# Patient Record
Sex: Female | Born: 1940 | Race: White | Hispanic: No | Marital: Married | Smoking: Former smoker
Health system: Southern US, Community
[De-identification: ages and names within clinical notes are randomized; demographics above are authoritative.]

## PROBLEM LIST (undated history)

## (undated) DIAGNOSIS — K5090 Crohn's disease, unspecified, without complications: Secondary | ICD-10-CM

## (undated) DIAGNOSIS — T4145XA Adverse effect of unspecified anesthetic, initial encounter: Secondary | ICD-10-CM

## (undated) DIAGNOSIS — R112 Nausea with vomiting, unspecified: Secondary | ICD-10-CM

## (undated) DIAGNOSIS — D649 Anemia, unspecified: Secondary | ICD-10-CM

## (undated) DIAGNOSIS — T8859XA Other complications of anesthesia, initial encounter: Secondary | ICD-10-CM

## (undated) DIAGNOSIS — Z9889 Other specified postprocedural states: Secondary | ICD-10-CM

## (undated) DIAGNOSIS — Z87442 Personal history of urinary calculi: Secondary | ICD-10-CM

## (undated) HISTORY — DX: Crohn's disease, unspecified, without complications: K50.90

## (undated) HISTORY — PX: COLON SURGERY: SHX602

## (undated) HISTORY — PX: CHOLECYSTECTOMY: SHX55

## (undated) HISTORY — PX: APPENDECTOMY: SHX54

## (undated) HISTORY — PX: SMALL INTESTINE SURGERY: SHX150

---

## 1979-08-11 HISTORY — PX: NEPHROSTOMY: SHX1014

## 2009-06-24 ENCOUNTER — Ambulatory Visit (HOSPITAL_BASED_OUTPATIENT_CLINIC_OR_DEPARTMENT_OTHER): Admission: RE | Admit: 2009-06-24 | Discharge: 2009-06-24 | Payer: Self-pay | Admitting: Orthopedic Surgery

## 2010-11-12 LAB — POCT I-STAT, CHEM 8
BUN: 18 mg/dL (ref 6–23)
Creatinine, Ser: 0.8 mg/dL (ref 0.4–1.2)
Hemoglobin: 13.6 g/dL (ref 12.0–15.0)
Potassium: 4 mEq/L (ref 3.5–5.1)
Sodium: 140 mEq/L (ref 135–145)
TCO2: 20 mmol/L (ref 0–100)

## 2010-11-12 LAB — POCT HEMOGLOBIN-HEMACUE: Hemoglobin: 14 g/dL (ref 12.0–15.0)

## 2010-11-12 LAB — BASIC METABOLIC PANEL
CO2: 26 mEq/L (ref 19–32)
Chloride: 106 mEq/L (ref 96–112)
GFR calc Af Amer: >60 mL/min (ref >60)
Sodium: 139 mEq/L (ref 135–145)

## 2015-09-11 ENCOUNTER — Other Ambulatory Visit (INDEPENDENT_AMBULATORY_CARE_PROVIDER_SITE_OTHER): Payer: Medicare Other

## 2015-09-11 ENCOUNTER — Ambulatory Visit (INDEPENDENT_AMBULATORY_CARE_PROVIDER_SITE_OTHER): Payer: Medicare Other | Admitting: Gastroenterology

## 2015-09-11 ENCOUNTER — Encounter: Payer: Self-pay | Admitting: Gastroenterology

## 2015-09-11 VITALS — BP 110/80 | HR 72 | Ht 60.0 in | Wt 192.8 lb

## 2015-09-11 DIAGNOSIS — K921 Melena: Secondary | ICD-10-CM

## 2015-09-11 DIAGNOSIS — K50119 Crohn's disease of large intestine with unspecified complications: Secondary | ICD-10-CM | POA: Diagnosis not present

## 2015-09-11 DIAGNOSIS — R1031 Right lower quadrant pain: Secondary | ICD-10-CM | POA: Diagnosis not present

## 2015-09-11 LAB — CBC WITH DIFFERENTIAL/PLATELET
BASOS ABS: 0.1 10*3/uL (ref 0.0–0.1)
Basophils Relative: 0.5 % (ref 0.0–3.0)
EOS ABS: 0.2 10*3/uL (ref 0.0–0.7)
Eosinophils Relative: 1.8 % (ref 0.0–5.0)
HEMATOCRIT: 38.5 % (ref 36.0–46.0)
Hemoglobin: 12.6 g/dL (ref 12.0–15.0)
LYMPHS ABS: 3.3 10*3/uL (ref 0.7–4.0)
LYMPHS PCT: 30.9 % (ref 12.0–46.0)
MCHC: 32.6 g/dL (ref 30.0–36.0)
MCV: 91.2 fl (ref 78.0–100.0)
MONOS PCT: 8.9 % (ref 3.0–12.0)
Monocytes Absolute: 0.9 10*3/uL (ref 0.1–1.0)
NEUTROS ABS: 6.2 10*3/uL (ref 1.4–7.7)
NEUTROS PCT: 57.9 % (ref 43.0–77.0)
PLATELETS: 296 10*3/uL (ref 150.0–400.0)
RBC: 4.22 Mil/uL (ref 3.87–5.11)
RDW: 15.1 % (ref 11.5–15.5)
WBC: 10.7 10*3/uL — ABNORMAL HIGH (ref 4.0–10.5)

## 2015-09-11 LAB — BASIC METABOLIC PANEL
BUN: 18 mg/dL (ref 6–23)
CALCIUM: 10 mg/dL (ref 8.4–10.5)
CO2: 27 mEq/L (ref 19–32)
Chloride: 106 mEq/L (ref 96–112)
Creatinine, Ser: 0.78 mg/dL (ref 0.40–1.20)
GFR: 77.01 mL/min (ref >60.00)
GLUCOSE: 118 mg/dL — AB (ref 70–99)
Potassium: 4.1 mEq/L (ref 3.5–5.1)
Sodium: 143 mEq/L (ref 135–145)

## 2015-09-11 LAB — HEPATIC FUNCTION PANEL
ALK PHOS: 86 U/L (ref 39–117)
ALT: 21 U/L (ref 0–35)
AST: 27 U/L (ref 0–37)
Albumin: 3.8 g/dL (ref 3.5–5.2)
BILIRUBIN DIRECT: 0.2 mg/dL (ref 0.0–0.3)
TOTAL PROTEIN: 6.9 g/dL (ref 6.0–8.3)
Total Bilirubin: 0.5 mg/dL (ref 0.2–1.2)

## 2015-09-11 LAB — SEDIMENTATION RATE: Sed Rate: 22 mm/hr (ref 0–22)

## 2015-09-11 LAB — VITAMIN B12: VITAMIN B 12: 249 pg/mL (ref 211–911)

## 2015-09-11 LAB — FOLATE

## 2015-09-11 LAB — FERRITIN: FERRITIN: 28 ng/mL (ref 10.0–291.0)

## 2015-09-11 MED ORDER — PANTOPRAZOLE SODIUM 40 MG PO TBEC: 40.0000 mg | DELAYED_RELEASE_TABLET | Freq: Every day | ORAL | Status: DC

## 2015-09-11 NOTE — Progress Notes (Signed)
Kara Schultz    [id number]    1941-03-10  Primary Care Physician:MAHONEY,MARK, DO  Referring Physician: No referring provider defined for this encounter.  Chief complaint:  Melena  HPI: 75 year old female with long-standing history of Crohn's disease diagnosed about 50 years ago, is s/p R hemicolectomy along with half of ileum was resected. She reports starting to have pain on R side 2 months ago, that comes and goes. No association with diet or activity.  She had an episode of Shingles on L abdomen extending onto her back a few weeks ago and started taking Tramadol and Hydrocodone for pain control in addition to the Alleve 2 a day that she is been taking for the past year for arthritis. She noticed black tarry stool since Sunday for past 4 days, no increase in stool frequency. At baseline she has 5-6 bowel movements on a regular day and during flareup she could have 10-15 bowel movements a day. She hasn't been on any maintenance therapy for Crohn's for the past 30-40 years and is mostly managing with diet. Denies any epigastric abdominal pain, nausea, vomiting or bright red blood per rectum       Outpatient Encounter Prescriptions as of 09/11/2015  Medication Sig  . Cyanocobalamin (VITAMIN B-12) 1000 MCG/15ML LIQD Take by mouth every 30 (thirty) days.  Marland Kitchen glucosamine-chondroitin 500-400 MG tablet Take 1 tablet by mouth 3 (three) times daily.  . Multiple Vitamin (MULTIVITAMIN) tablet Take 1 tablet by mouth daily.  . naproxen sodium (ANAPROX) 220 MG tablet Take 220 mg by mouth 2 (two) times daily with a meal.  . NON FORMULARY Cur-A-Med 1 capsule daily  . traMADol (ULTRAM) 50 MG tablet Take 50 mg by mouth every 6 (six) hours as needed.   No facility-administered encounter medications on file as of 09/11/2015.    Allergies as of 09/11/2015  . (No Known Allergies)    Past Medical History  Diagnosis Date  . Crohn's disease [facility])     Past Surgical History  Procedure  Laterality Date  . Appendectomy    . Cholecystectomy    . Colon surgery    . Small intestine surgery      Family History  Problem Relation Age of Onset  . Breast cancer Mother   . Prostate cancer Father   . Prostate cancer Brother   . Colon cancer Brother   . Crohn's disease Brother   . Lung cancer Mother     Social History   Social History  . Marital Status: Married    Spouse Name: N/A  . Number of Children: 2  . Years of Education: N/A   Occupational History  . Retired    Social History Main Topics  . Smoking status: Former Research scientist (life sciences)  . Smokeless tobacco: Never Used  . Alcohol Use: No  . Drug Use: No  . Sexual Activity: Not on file   Other Topics Concern  . Not on file   Social History Narrative  . No narrative on file      Review of systems: Review of Systems  Constitutional: Negative for fever and chills.  HENT: Negative.   Eyes: Negative for blurred vision.  Respiratory: Negative for cough, shortness of breath and wheezing.   Cardiovascular: Negative for chest pain and palpitations.  Gastrointestinal: as per HPI Genitourinary: Negative for dysuria, urgency, frequency and hematuria.  Musculoskeletal: Negative for myalgias, back pain and joint pain.  Skin: Negative for itching and rash.  Neurological: Negative for dizziness, tremors, focal weakness, seizures and loss of consciousness.  Endo/Heme/Allergies: Negative for environmental allergies.  Psychiatric/Behavioral: Negative for depression, suicidal ideas and hallucinations.  All other systems reviewed and are negative.   Physical Exam: Filed Vitals:   09/11/15 1512  BP: 110/80  Pulse: 72   Gen:      No acute distress HEENT:  EOMI, sclera anicteric Neck:     No masses; no thyromegaly Lungs:    Clear to auscultation bilaterally; normal respiratory effort CV:         Regular rate and rhythm; no murmurs Abd:      + bowel sounds; soft, non-tender; no palpable masses, no distension Ext:    No  edema; adequate peripheral perfusion Skin:      Warm and dry; no rash Neuro: alert and oriented x 3 Psych: normal mood and affect   Assessment and Plan/Recommendations:  75 year old female with long-standing history of Crohn's disease status post right hemicolectomy and partial ileectomy here with complaints of melena and right lower quadrant abdominal pain for the past 4 days. She has history of chronic NSAID use Check CBC, BMP, LFT, ESR, iron panel, B12 & folate If labs are stable vital schedule for endoscopy electively as outpatient but if has any significant drop in hemoglobin we will have to consider admitting the patient to the hospital and do an urgent EGD Advise patient to seek medical care if she develops dizziness or starts having an increase in episodes  Had a colonoscopy in 2013 in [place], we'll try to obtain the records   K. Denzil Magnuson , MD [phone] Mon-Fri 8a-5p [phone] after 5p, weekends, holidays

## 2015-09-11 NOTE — Patient Instructions (Signed)
Go to the basement for labs today  You have been scheduled for an endoscopy. Please follow written instructions given to you at your visit today. If you use inhalers (even only as needed), please bring them with you on the day of your procedure. Your physician has requested that you go to www.startemmi.com and enter the access code given to you at your visit today. This web site gives a general overview about your procedure. However, you should still follow specific instructions given to you by our office regarding your preparation for the procedure. 

## 2015-09-12 ENCOUNTER — Telehealth: Payer: Self-pay | Admitting: Gastroenterology

## 2015-09-12 NOTE — Telephone Encounter (Signed)
ok 

## 2015-09-12 NOTE — Telephone Encounter (Signed)
Patient says she is doing "a whole lot better". She would like to cancel the EGD.

## 2015-10-16 ENCOUNTER — Encounter: Payer: Medicare Other | Admitting: Gastroenterology

## 2017-01-01 ENCOUNTER — Other Ambulatory Visit: Payer: Self-pay | Admitting: Physician Assistant

## 2017-01-01 ENCOUNTER — Ambulatory Visit: Payer: Medicare Other

## 2017-01-01 ENCOUNTER — Encounter: Payer: Self-pay | Admitting: Physician Assistant

## 2017-01-01 ENCOUNTER — Ambulatory Visit (INDEPENDENT_AMBULATORY_CARE_PROVIDER_SITE_OTHER): Payer: Medicare Other

## 2017-01-01 ENCOUNTER — Ambulatory Visit (INDEPENDENT_AMBULATORY_CARE_PROVIDER_SITE_OTHER): Payer: Medicare Other | Admitting: Physician Assistant

## 2017-01-01 VITALS — BP 127/77 | HR 82 | Ht 60.0 in | Wt 185.0 lb

## 2017-01-01 DIAGNOSIS — W108XXA Fall (on) (from) other stairs and steps, initial encounter: Secondary | ICD-10-CM

## 2017-01-01 DIAGNOSIS — S8002XA Contusion of left knee, initial encounter: Secondary | ICD-10-CM | POA: Diagnosis not present

## 2017-01-01 DIAGNOSIS — M1712 Unilateral primary osteoarthritis, left knee: Secondary | ICD-10-CM | POA: Insufficient documentation

## 2017-01-01 DIAGNOSIS — M25562 Pain in left knee: Secondary | ICD-10-CM

## 2017-01-01 DIAGNOSIS — W19XXXA Unspecified fall, initial encounter: Secondary | ICD-10-CM

## 2017-01-01 MED ORDER — TRAMADOL HCL 50 MG PO TABS: 50.0000 mg | ORAL_TABLET | Freq: Three times a day (TID) | ORAL | 0 refills | Status: DC | PRN

## 2017-01-01 NOTE — Progress Notes (Signed)
BP 127/77   Pulse 82   Ht 5' (1.524 m)   Wt 185 lb (83.9 kg)   BMI 36.13 kg/m    Subjective:    Patient ID: Kara Schultz, female    DOB: 1941-01-31, 76 y.o.   MRN: [id number]  HPI: Kara Schultz is a 76 y.o. female presenting on 01/01/2017 for Fall (hurt left knee)  This is a new patient to our office today. She has known degenerative joint disease of the left knee. She is planning a total knee replacement in a few weeks. She is seen by Dr. Lequita Halt in [place].  Last night she was startled by upper when she opened up her basement door. It caused her to fall directly on her left knee at the same time she hit her left forehead. She had not had any problems with dizziness or loss of consciousness. The pain was very sharp in the left knee. She had a very large amount of swelling to the anterior portion of the knee through the night it did go down. Should very minimal sleep during the night due to the pain. She is seen in our office for an x-ray and support until she can get to the orthopedist.  Relevant past medical, surgical, family and social history reviewed and updated as indicated. Allergies and medications reviewed and updated.  Past Medical History:  Diagnosis Date  . Crohn's disease [facility])     Past Surgical History:  Procedure Laterality Date  . APPENDECTOMY    . CHOLECYSTECTOMY    . COLON SURGERY    . SMALL INTESTINE SURGERY      Review of Systems  Constitutional: Negative.  Negative for activity change, fatigue and fever.  HENT: Negative.   Eyes: Negative.   Respiratory: Negative.  Negative for cough.   Cardiovascular: Negative.  Negative for chest pain.  Gastrointestinal: Negative.  Negative for abdominal pain.  Endocrine: Negative.   Genitourinary: Negative.  Negative for dysuria.  Musculoskeletal: Positive for arthralgias, gait problem and joint swelling.  Skin: Positive for color change.    Allergies as of 01/01/2017      Reactions   Hydrocodone       Medication List       Accurate as of 01/01/17  4:11 PM. Always use your most recent med list.          traMADol 50 MG tablet Commonly known as:  ULTRAM Take 1-2 tablets (50-100 mg total) by mouth every 8 (eight) hours as needed.   Vitamin B-12 1000 MCG/15ML Liqd Take by mouth every 30 (thirty) days.   Vitamin D3 50000 units Caps Vitamin D2 50,000 unit capsule  Take 1 capsule every week by oral route for 30 days.          Objective:    BP 127/77   Pulse 82   Ht 5' (1.524 m)   Wt 185 lb (83.9 kg)   BMI 36.13 kg/m   Allergies  Allergen Reactions  . Hydrocodone     Physical Exam  Constitutional: She is oriented to person, place, and time. She appears well-developed and well-nourished.  HENT:  Head: Normocephalic and atraumatic.  Eyes: Conjunctivae and EOM are normal. Pupils are equal, round, and reactive to light.  Cardiovascular: Normal rate, regular rhythm, normal heart sounds and intact distal pulses.   Pulmonary/Chest: Effort normal and breath sounds normal.  Abdominal: Soft. Bowel sounds are normal.  Musculoskeletal:       Left knee: She exhibits decreased range of motion,  swelling and deformity. Tenderness found. Lateral joint line tenderness noted.       Legs: Significant swelling in the left knee with bruising. There is lateral tenderness and decreased range of motion. She even hurts into the posterior portion of the knee  Neurological: She is alert and oriented to person, place, and time. She has normal reflexes.  Skin: Skin is warm and dry. No rash noted.  Psychiatric: She has a normal mood and affect. Her behavior is normal. Judgment and thought content normal.        Assessment & Plan:   1. Fall, initial encounter - traMADol (ULTRAM) 50 MG tablet; Take 1-2 tablets (50-100 mg total) by mouth every 8 (eight) hours as needed.  Dispense: 60 tablet; Refill: 0  2. Contusion of left knee, initial encounter - traMADol (ULTRAM) 50 MG tablet; Take 1-2  tablets (50-100 mg total) by mouth every 8 (eight) hours as needed.  Dispense: 60 tablet; Refill: 0  3. Primary osteoarthritis of left knee   Current Outpatient Prescriptions:  .  Cholecalciferol (VITAMIN D3) 50000 units CAPS, Vitamin D2 50,000 unit capsule  Take 1 capsule every week by oral route for 30 days., Disp: , Rfl:  .  Cyanocobalamin (VITAMIN B-12) 1000 MCG/15ML LIQD, Take by mouth every 30 (thirty) days., Disp: , Rfl:  .  traMADol (ULTRAM) 50 MG tablet, Take 1-2 tablets (50-100 mg total) by mouth every 8 (eight) hours as needed., Disp: 60 tablet, Rfl: 0  Continue all other maintenance medications as listed above.  Follow up plan: Return if symptoms worsen or fail to improve.  Educational handout given for contusion  Remus Loffler PA-C [facility] [address]  [place], [place] [address] [phone]   01/01/2017, 4:11 PM

## 2017-01-01 NOTE — Patient Instructions (Signed)
Contusion A contusion is a deep bruise. Contusions happen when an injury causes bleeding under the skin. Symptoms of bruising include pain, swelling, and discolored skin. The skin may turn blue, purple, or yellow. Follow these instructions at home:  Rest the injured area.  If told, put ice on the injured area.  Put ice in a plastic bag.  Place a towel between your skin and the bag.  Leave the ice on for 20 minutes, 2-3 times per day.  If told, put light pressure (compression) on the injured area using an elastic bandage. Make sure the bandage is not too tight. Remove it and put it back on as told by your doctor.  If possible, raise (elevate) the injured area above the level of your heart while you are sitting or lying down.  Take over-the-counter and prescription medicines only as told by your doctor. Contact a doctor if:  Your symptoms do not get better after several days of treatment.  Your symptoms get worse.  You have trouble moving the injured area. Get help right away if:  You have very bad pain.  You have a loss of feeling (numbness) in a hand or foot.  Your hand or foot turns pale or cold. This information is not intended to replace advice given to you by your health care provider. Make sure you discuss any questions you have with your health care provider. Document Released: 01/13/2008 Document Revised: 01/02/2016 Document Reviewed: 12/12/2014 Elsevier Interactive Patient Education  2017 Elsevier Inc.  

## 2017-02-12 NOTE — Progress Notes (Signed)
Please place orders in EPIC as patient is being scheduled for a pre-op appointment! 

## 2017-02-16 ENCOUNTER — Ambulatory Visit: Payer: Self-pay | Admitting: Orthopedic Surgery

## 2017-02-23 NOTE — Patient Instructions (Addendum)
Kara Schultz  02/23/2017   Your procedure is scheduled on: 03-08-17  Report to [facility] Main  Entrance Report to Admitting at 9:05 AM   Call this number if you have problems the morning of surgery [phone]   Remember: ONLY 1 PERSON MAY GO WITH YOU TO SHORT STAY TO GET  READY MORNING OF YOUR SURGERY.  Do not eat food or drink liquids :After Midnight.     Take these medicines the morning of surgery with A SIP OF WATER: None                                You may not have any metal on your body including hair pins and              piercings  Do not wear jewelry, make-up, lotions, powders or perfumes, deodorant             Do not wear nail polish.  Do not shave  48 hours prior to surgery.     Do not bring valuables to the hospital. [facility] IS NOT             RESPONSIBLE   FOR VALUABLES.  Contacts, dentures or bridgework may not be worn into surgery.  Leave suitcase in the car. After surgery it may be brought to your room.                  Please read over the following fact sheets you were given: _____________________________________________________________________             [facility] - Preparing for Surgery Before surgery, you can play an important role.  Because skin is not sterile, your skin needs to be as free of germs as possible.  You can reduce the number of germs on your skin by washing with CHG (chlorahexidine gluconate) soap before surgery.  CHG is an antiseptic cleaner which kills germs and bonds with the skin to continue killing germs even after washing. Please DO NOT use if you have an allergy to CHG or antibacterial soaps.  If your skin becomes reddened/irritated stop using the CHG and inform your nurse when you arrive at Short Stay. Do not shave (including legs and underarms) for at least 48 hours prior to the first CHG shower.  You may shave your face/neck. Please follow these instructions carefully:  1.  Shower with CHG  Soap the night before surgery and the  morning of Surgery.  2.  If you choose to wash your hair, wash your hair first as usual with your  normal  shampoo.  3.  After you shampoo, rinse your hair and body thoroughly to remove the  shampoo.                           4.  Use CHG as you would any other liquid soap.  You can apply chg directly  to the skin and wash                       Gently with a scrungie or clean washcloth.  5.  Apply the CHG Soap to your body ONLY FROM THE NECK DOWN.   Do not use on face/ open  Wound or open sores. Avoid contact with eyes, ears mouth and genitals (private parts).                       Wash face,  Genitals (private parts) with your normal soap.             6.  Wash thoroughly, paying special attention to the area where your surgery  will be performed.  7.  Thoroughly rinse your body with warm water from the neck down.  8.  DO NOT shower/wash with your normal soap after using and rinsing off  the CHG Soap.                9.  Pat yourself dry with a clean towel.            10.  Wear clean pajamas.            11.  Place clean sheets on your bed the night of your first shower and do not  sleep with pets. Day of Surgery : Do not apply any lotions/deodorants the morning of surgery.  Please wear clean clothes to the hospital/surgery center.  FAILURE TO FOLLOW THESE INSTRUCTIONS MAY RESULT IN THE CANCELLATION OF YOUR SURGERY PATIENT SIGNATURE_________________________________  NURSE SIGNATURE__________________________________  ________________________________________________________________________   Kara Schultz  An incentive spirometer is a tool that can help keep your lungs clear and active. This tool measures how well you are filling your lungs with each breath. Taking long deep breaths may help reverse or decrease the chance of developing breathing (pulmonary) problems (especially infection) following:  A long period of time  when you are unable to move or be active. BEFORE THE PROCEDURE   If the spirometer includes an indicator to show your best effort, your nurse or respiratory therapist will set it to a desired goal.  If possible, sit up straight or lean slightly forward. Try not to slouch.  Hold the incentive spirometer in an upright position. INSTRUCTIONS FOR USE  1. Sit on the edge of your bed if possible, or sit up as far as you can in bed or on a chair. 2. Hold the incentive spirometer in an upright position. 3. Breathe out normally. 4. Place the mouthpiece in your mouth and seal your lips tightly around it. 5. Breathe in slowly and as deeply as possible, raising the piston or the ball toward the top of the column. 6. Hold your breath for 3-5 seconds or for as long as possible. Allow the piston or ball to fall to the bottom of the column. 7. Remove the mouthpiece from your mouth and breathe out normally. 8. Rest for a few seconds and repeat Steps 1 through 7 at least 10 times every 1-2 hours when you are awake. Take your time and take a few normal breaths between deep breaths. 9. The spirometer may include an indicator to show your best effort. Use the indicator as a goal to work toward during each repetition. 10. After each set of 10 deep breaths, practice coughing to be sure your lungs are clear. If you have an incision (the cut made at the time of surgery), support your incision when coughing by placing a pillow or rolled up towels firmly against it. Once you are able to get out of bed, walk around indoors and cough well. You may stop using the incentive spirometer when instructed by your caregiver.  RISKS AND COMPLICATIONS  Take your time so you do not get  dizzy or light-headed.  If you are in pain, you may need to take or ask for pain medication before doing incentive spirometry. It is harder to take a deep breath if you are having pain. AFTER USE  Rest and breathe slowly and easily.  It can be  helpful to keep track of a log of your progress. Your caregiver can provide you with a simple table to help with this. If you are using the spirometer at home, follow these instructions: [place] IF:   You are having difficultly using the spirometer.  You have trouble using the spirometer as often as instructed.  Your pain medication is not giving enough relief while using the spirometer.  You develop fever of 100.5 F (38.1 C) or higher. SEEK IMMEDIATE MEDICAL CARE IF:   You cough up bloody sputum that had not been present before.  You develop fever of 102 F (38.9 C) or greater.  You develop worsening pain at or near the incision site. MAKE SURE YOU:   Understand these instructions.  Will watch your condition.  Will get help right away if you are not doing well or get worse. Document Released: 12/07/2006 Document Revised: 10/19/2011 Document Reviewed: 02/07/2007 ExitCare Patient Information 2014 ExitCare, [place].   ________________________________________________________________________  WHAT IS A BLOOD TRANSFUSION? Blood Transfusion Information  A transfusion is the replacement of blood or some of its parts. Blood is made up of multiple cells which provide different functions.  Red blood cells carry oxygen and are used for blood loss replacement.  White blood cells fight against infection.  Platelets control bleeding.  Plasma helps clot blood.  Other blood products are available for specialized needs, such as hemophilia or other clotting disorders. BEFORE THE TRANSFUSION  Who gives blood for transfusions?   Healthy volunteers who are fully evaluated to make sure their blood is safe. This is blood bank blood. Transfusion therapy is the safest it has ever been in the practice of medicine. Before blood is taken from a donor, a complete history is taken to make sure that person has no history of diseases nor engages in risky social behavior (examples are  intravenous drug use or sexual activity with multiple partners). The donor's travel history is screened to minimize risk of transmitting infections, such as malaria. The donated blood is tested for signs of infectious diseases, such as HIV and hepatitis. The blood is then tested to be sure it is compatible with you in order to minimize the chance of a transfusion reaction. If you or a relative donates blood, this is often done in anticipation of surgery and is not appropriate for emergency situations. It takes many days to process the donated blood. RISKS AND COMPLICATIONS Although transfusion therapy is very safe and saves many lives, the main dangers of transfusion include:   Getting an infectious disease.  Developing a transfusion reaction. This is an allergic reaction to something in the blood you were given. Every precaution is taken to prevent this. The decision to have a blood transfusion has been considered carefully by your caregiver before blood is given. Blood is not given unless the benefits outweigh the risks. AFTER THE TRANSFUSION  Right after receiving a blood transfusion, you will usually feel much better and more energetic. This is especially true if your red blood cells have gotten low (anemic). The transfusion raises the level of the red blood cells which carry oxygen, and this usually causes an energy increase.  The nurse administering the transfusion will  monitor you carefully for complications. HOME CARE INSTRUCTIONS  No special instructions are needed after a transfusion. You may find your energy is better. Speak with your caregiver about any limitations on activity for underlying diseases you may have. SEEK MEDICAL CARE IF:   Your condition is not improving after your transfusion.  You develop redness or irritation at the intravenous (IV) site. SEEK IMMEDIATE MEDICAL CARE IF:  Any of the following symptoms occur over the next 12 hours:  Shaking chills.  You have a  temperature by mouth above 102 F (38.9 C), not controlled by medicine.  Chest, back, or muscle pain.  People around you feel you are not acting correctly or are confused.  Shortness of breath or difficulty breathing.  Dizziness and fainting.  You get a rash or develop hives.  You have a decrease in urine output.  Your urine turns a dark color or changes to pink, red, or brown. Any of the following symptoms occur over the next 10 days:  You have a temperature by mouth above 102 F (38.9 C), not controlled by medicine.  Shortness of breath.  Weakness after normal activity.  The white part of the eye turns yellow (jaundice).  You have a decrease in the amount of urine or are urinating less often.  Your urine turns a dark color or changes to pink, red, or brown. Document Released: 07/24/2000 Document Revised: 10/19/2011 Document Reviewed: 03/12/2008 [facility] Patient Information 2014 [place], [place].  _______________________________________________________________________

## 2017-02-23 NOTE — Progress Notes (Addendum)
02-15-17 Surgical clearance on chart from Marrian Salvage, PAC, 02-15-17 EKG, CXR, and office note on chart.

## 2017-02-25 ENCOUNTER — Encounter (HOSPITAL_COMMUNITY): Payer: Self-pay

## 2017-02-25 ENCOUNTER — Encounter (HOSPITAL_COMMUNITY)
Admission: RE | Admit: 2017-02-25 | Discharge: 2017-02-25 | Disposition: A | Discharge status: Home / Self Care | Payer: Medicare Other | Source: Ambulatory Visit | Attending: Orthopedic Surgery | Admitting: Orthopedic Surgery

## 2017-02-25 DIAGNOSIS — Z01812 Encounter for preprocedural laboratory examination: Secondary | ICD-10-CM | POA: Diagnosis present

## 2017-02-25 DIAGNOSIS — M1712 Unilateral primary osteoarthritis, left knee: Secondary | ICD-10-CM | POA: Insufficient documentation

## 2017-02-25 HISTORY — DX: Anemia, unspecified: D64.9

## 2017-02-25 HISTORY — DX: Personal history of urinary calculi: Z87.442

## 2017-02-25 HISTORY — DX: Adverse effect of unspecified anesthetic, initial encounter: T41.45XA

## 2017-02-25 HISTORY — DX: Other specified postprocedural states: Z98.890

## 2017-02-25 HISTORY — DX: Nausea with vomiting, unspecified: R11.2

## 2017-02-25 HISTORY — DX: Other complications of anesthesia, initial encounter: T88.59XA

## 2017-02-25 LAB — PROTIME-INR
INR: 1.06
PROTHROMBIN TIME: 13.8 s (ref 11.4–15.2)

## 2017-02-25 LAB — COMPREHENSIVE METABOLIC PANEL
ALK PHOS: 111 U/L (ref 38–126)
ALT: 20 U/L (ref 14–54)
AST: 26 U/L (ref 15–41)
Albumin: 3.7 g/dL (ref 3.5–5.0)
Anion gap: 5 (ref 5–15)
BUN: 14 mg/dL (ref 6–20)
CALCIUM: 9.3 mg/dL (ref 8.9–10.3)
CO2: 29 mmol/L (ref 22–32)
CREATININE: 0.69 mg/dL (ref 0.44–1.00)
Chloride: 106 mmol/L (ref 101–111)
Glucose, Bld: 97 mg/dL (ref 65–99)
Potassium: 4.4 mmol/L (ref 3.5–5.1)
Sodium: 140 mmol/L (ref 135–145)
Total Bilirubin: 0.5 mg/dL (ref 0.3–1.2)
Total Protein: 6.9 g/dL (ref 6.5–8.1)

## 2017-02-25 LAB — CBC
HEMATOCRIT: 40.2 % (ref 36.0–46.0)
HEMOGLOBIN: 12.9 g/dL (ref 12.0–15.0)
MCH: 29.3 pg (ref 26.0–34.0)
MCHC: 32.1 g/dL (ref 30.0–36.0)
MCV: 91.2 fL (ref 78.0–100.0)
Platelets: 218 10*3/uL (ref 150–400)
RBC: 4.41 MIL/uL (ref 3.87–5.11)
RDW: 13.8 % (ref 11.5–15.5)
WBC: 7.7 10*3/uL (ref 4.0–10.5)

## 2017-02-25 LAB — TYPE AND SCREEN
ABO/RH(D): O NEG
Antibody Screen: NEGATIVE

## 2017-02-25 LAB — SURGICAL PCR SCREEN
MRSA, PCR: NEGATIVE
Staphylococcus aureus: NEGATIVE

## 2017-02-25 LAB — ABO/RH: ABO/RH(D): O NEG

## 2017-02-25 LAB — APTT: aPTT: 36 seconds (ref 24–36)

## 2017-03-07 ENCOUNTER — Ambulatory Visit: Payer: Self-pay | Admitting: Orthopedic Surgery

## 2017-03-07 NOTE — H&P (Signed)
Kara Schultz DOB: Sep 12, 1940 Married / Language: English / Race: White Female Date of Admission:  03/08/2017 CC:  Left Knee Pain History of Present Illness The patient is a 76 year old female who comes in for a preoperative History and Physical. The patient is scheduled for a left total knee arthroplasty to be performed by Dr. Gus Rankin. Aluisio, MD at [facility] on 03-08-2017. The patient is a 76 year old female who presents with knee complaints. The patient reports left knee symptoms including: pain and grinding (popping) which began year(s) ago without any known injury. The patient describes their pain as aching.The patient feels that the symptoms are worsening. The patient has the current diagnosis of knee osteoarthritis. This problem has not been previously evaluated. Past treatment for this problem has included intra-articular injection of corticosteroids (cortisone injection about 3 years ago: helped. seen Dr. Wyline Mood in the past.). Symptoms are reported to be located in the left knee and include knee pain, stiffness, decreased range of motion, instability and difficulty bearing weight. The patient does not report any radiation of symptoms. Current treatment includes application of ice and nonsteroidal anti-inflammatory drugs (also using Voltaren gel). Note for "Knee pain": HX left knee scope about 15 years ago: removed piece of bone-Dr. Ardelle Anton Ms. Boniface is the mother of Gennette Pac, Nurse Practitioner over at [employer]. Ms, Fazzi has had problems with her knees for many years now. Recently, she has had a marked increase in problems with her knee. It hurts her with most activities, but it has gotten very painful to the point where she could barely even walk a few weeks ago. She has been treated in the past in [place] with injections and had an arthroscopy about 15 years ago. She has been told for several years that she is going need her knee replaced.   The right knee does not currently hurt. She is not having any hip pain.  She is ready to proceed with surgery at this time. Risks and benefits of the surgery have been discussed with the patient and they elect to proceed with surgery.  There are on active contraindications to upcoming procedure such as ongoing infection or progressive neurological disease.   Problem List/Past Medical  Pernicious anemia (D51.0)  Crohn's Disease  Kidney Stone  Primary osteoarthritis of left knee (M17.12)  Obesity  Osteoarthritis  Menopause    Allergies  OxyCODONE HCl *ANALGESICS - OPIOID*  Nausea. HYDROcodone Bitartrate *CHEMICALS*  Nausea. PredniSONE *CORTICOSTEROIDS*  Unknown RXN  Family History Cancer  Brother, Father, Mother. Father - deceased age 69 - cancer Mother - deceased age 90 - cancer  Social History  Children  2 Current work status  retired Scientist, physiological weekly; does other Living situation  live with spouse Marital status  married Never consumed alcohol  12/25/2016: Never consumed alcohol No history of drug/alcohol rehab  Not under pain contract  Number of flights of stairs before winded  greater than 5 Tobacco / smoke exposure  12/25/2016: no Tobacco use  Former smoker. 12/25/2016: smoke(d) 2 pack(s) per day  Medication History Vitamin D (2000UNIT Tablet, Oral) Active. Tylenol (325MG  Tablet, 1 (one) Oral) Active. B12 injection once a month Active.  Past Surgical History Appendectomy  Arthroscopy of Shoulder  left Colectomy  partial Gallbladder Surgery  open Tonsillectomy    Review of Systems  General Not Present- Chills, Fatigue, Fever, Memory Loss, Night Sweats, Weight Gain and Weight Loss. Skin Not Present- Eczema, Hives, Itching, Lesions and Rash.  HEENT Not Present- Dentures, Double Vision, Headache, Hearing Loss, Tinnitus and Visual Loss. Respiratory Not Present- Allergies, Chronic Cough, Coughing up blood, Shortness of breath  at rest and Shortness of breath with exertion. Cardiovascular Not Present- Chest Pain, Difficulty Breathing Lying Down, Murmur, Palpitations, Racing/skipping heartbeats and Swelling. Gastrointestinal Not Present- Abdominal Pain, Bloody Stool, Constipation, Diarrhea, Difficulty Swallowing, Heartburn, Jaundice, Loss of appetitie, Nausea and Vomiting. Female Genitourinary Not Present- Blood in Urine, Discharge, Flank Pain, Incontinence, Painful Urination, Urgency, Urinary frequency, Urinary Retention, Urinating at Night and Weak urinary stream. Musculoskeletal Present- Joint Pain, Not Present - Joint Swelling, Morning Stiffness, Muscle Pain, Muscle Weakness and Spasms. Neurological Not Present- Blackout spells, Difficulty with balance, Dizziness, Paralysis, Tremor and Weakness. Psychiatric Not Present- Insomnia.  Vitals  Weight: 180 lb Height: 60in Body Surface Area: 1.78 m Body Mass Index: 35.15 kg/m  Pulse: 64 (Regular)  BP: 144/88 (Sitting, Right Arm, Standard)    Physical Exam  General Mental Status -Alert, cooperative and good historian. General Appearance-pleasant, Not in acute distress. Orientation-Oriented X3. Build & Nutrition-Well nourished and Well developed.  Head and Neck Head-normocephalic, atraumatic . Neck Global Assessment - supple, no bruit auscultated on the right, no bruit auscultated on the left.  Eye Vision-Wears contact lenses. Pupil - Bilateral-Regular and Round. Motion - Bilateral-EOMI.  Chest and Lung Exam Auscultation Breath sounds - clear at anterior chest wall and clear at posterior chest wall. Adventitious sounds - No Adventitious sounds.  Cardiovascular Auscultation Rhythm - Regular rate and rhythm. Heart Sounds - S1 WNL and S2 WNL. Murmurs & Other Heart Sounds - Auscultation of the heart reveals - No Murmurs.  Abdomen Palpation/Percussion Tenderness - Abdomen is non-tender to palpation. Rigidity (guarding) - Abdomen is  soft. Auscultation Auscultation of the abdomen reveals - Bowel sounds normal.  Musculoskeletal Note: A well developed female, alert and oriented, in no apparent distress. Her hip show normal range of motion with no discomfort. Her right knee shows no effusion. Range of motion of the right knee about 0 to 135 with no tenderness or instability. Left knee slight varus. Range 5 to 125 with marked crepitus on range of motion with tenderness medial greater than lateral with no instability noted. Pulse, sensation, motor intact distally. She has significant antalgic gait pattern on the left.  RADIOGRAPHS AP, lateral of both knees show that the left knee has bone on bone arthritis in the medial and patellofemoral compartments. Her right knee looks fairly normal, which just mild degeneration.   Assessment & Plan Primary osteoarthritis of left knee (M17.12)  Note:Surgical Plans: Left Total Knee Replacement  Disposition: Home, In[facility]Home [facility], Lives in [place].  PCP: Home with husband and daughter  IV TXA  Anesthesia Issues: Nausea and vomiting  Patient was instructed on what medications to stop prior to surgery.  Signed electronically by Lauraine Rinne, III PA-C

## 2017-03-07 NOTE — H&P (Deleted)
  The note originally documented on this encounter has been moved the the encounter in which it belongs.  

## 2017-03-08 ENCOUNTER — Inpatient Hospital Stay (HOSPITAL_COMMUNITY)
Admission: RE | Admit: 2017-03-08 | Discharge: 2017-03-10 | DRG: 470 | Disposition: A | Discharge status: Home / Self Care | Payer: Medicare Other | Source: Ambulatory Visit | Attending: Orthopedic Surgery | Admitting: Orthopedic Surgery

## 2017-03-08 ENCOUNTER — Inpatient Hospital Stay (HOSPITAL_COMMUNITY): Payer: Medicare Other | Admitting: Certified Registered Nurse Anesthetist

## 2017-03-08 ENCOUNTER — Encounter (HOSPITAL_COMMUNITY): Payer: Self-pay | Admitting: *Deleted

## 2017-03-08 ENCOUNTER — Encounter (HOSPITAL_COMMUNITY)
Admission: RE | Disposition: A | Discharge status: Home / Self Care | Payer: Self-pay | Source: Ambulatory Visit | Attending: Orthopedic Surgery

## 2017-03-08 DIAGNOSIS — Z87891 Personal history of nicotine dependence: Secondary | ICD-10-CM

## 2017-03-08 DIAGNOSIS — E669 Obesity, unspecified: Secondary | ICD-10-CM | POA: Diagnosis present

## 2017-03-08 DIAGNOSIS — K5090 Crohn's disease, unspecified, without complications: Secondary | ICD-10-CM | POA: Diagnosis present

## 2017-03-08 DIAGNOSIS — M179 Osteoarthritis of knee, unspecified: Secondary | ICD-10-CM | POA: Diagnosis present

## 2017-03-08 DIAGNOSIS — M1710 Unilateral primary osteoarthritis, unspecified knee: Secondary | ICD-10-CM | POA: Diagnosis present

## 2017-03-08 DIAGNOSIS — D510 Vitamin B12 deficiency anemia due to intrinsic factor deficiency: Secondary | ICD-10-CM | POA: Diagnosis present

## 2017-03-08 DIAGNOSIS — Z6836 Body mass index (BMI) 36.0-36.9, adult: Secondary | ICD-10-CM | POA: Diagnosis not present

## 2017-03-08 DIAGNOSIS — M1712 Unilateral primary osteoarthritis, left knee: Principal | ICD-10-CM | POA: Diagnosis present

## 2017-03-08 HISTORY — PX: TOTAL KNEE ARTHROPLASTY: SHX125

## 2017-03-08 SURGERY — ARTHROPLASTY, KNEE, TOTAL
Anesthesia: Spinal | Site: Knee | Laterality: Left

## 2017-03-08 MED ORDER — FENTANYL CITRATE (PF) 100 MCG/2ML IJ SOLN
100.0000 ug | Freq: Once | INTRAMUSCULAR | Status: AC
  Administered 2017-03-08: 50 ug via INTRAVENOUS

## 2017-03-08 MED ORDER — FENTANYL CITRATE (PF) 100 MCG/2ML IJ SOLN
INTRAMUSCULAR | Status: DC | PRN
  Administered 2017-03-08 (×2): 50 ug via INTRAVENOUS

## 2017-03-08 MED ORDER — OXYCODONE HCL 5 MG PO TABS
5.0000 mg | ORAL_TABLET | ORAL | Status: DC | PRN
  Administered 2017-03-08: 15:00:00 5 mg via ORAL
  Administered 2017-03-08 (×2): 10 mg via ORAL
  Administered 2017-03-08 – 2017-03-09 (×2): 5 mg via ORAL
  Filled 2017-03-08: qty 1
  Filled 2017-03-08 (×2): qty 2
  Filled 2017-03-08: qty 1
  Filled 2017-03-08: qty 2

## 2017-03-08 MED ORDER — ACETAMINOPHEN 325 MG PO TABS: 650.0000 mg | ORAL_TABLET | Freq: Four times a day (QID) | ORAL | Status: DC | PRN

## 2017-03-08 MED ORDER — ACETAMINOPHEN 10 MG/ML IV SOLN
INTRAVENOUS | Status: DC | PRN
  Administered 2017-03-08: 1000 mg via INTRAVENOUS

## 2017-03-08 MED ORDER — DIPHENHYDRAMINE HCL 12.5 MG/5ML PO ELIX: 12.5000 mg | ORAL_SOLUTION | ORAL | Status: DC | PRN

## 2017-03-08 MED ORDER — CEFAZOLIN SODIUM-DEXTROSE 2-4 GM/100ML-% IV SOLN
2.0000 g | Freq: Four times a day (QID) | INTRAVENOUS | Status: AC
  Administered 2017-03-08 (×2): 2 g via INTRAVENOUS
  Filled 2017-03-08 (×2): qty 100

## 2017-03-08 MED ORDER — SODIUM CHLORIDE 0.9 % IV SOLN
INTRAVENOUS | Status: DC
  Administered 2017-03-08: 75 mL/h via INTRAVENOUS
  Administered 2017-03-09: 06:00:00 via INTRAVENOUS

## 2017-03-08 MED ORDER — PHENYLEPHRINE 40 MCG/ML (10ML) SYRINGE FOR IV PUSH (FOR BLOOD PRESSURE SUPPORT)
PREFILLED_SYRINGE | INTRAVENOUS | Status: AC
  Filled 2017-03-08: qty 10

## 2017-03-08 MED ORDER — METOCLOPRAMIDE HCL 5 MG PO TABS
5.0000 mg | ORAL_TABLET | Freq: Three times a day (TID) | ORAL | Status: DC | PRN
  Administered 2017-03-09: 10 mg via ORAL
  Filled 2017-03-08: qty 2

## 2017-03-08 MED ORDER — EPHEDRINE 5 MG/ML INJ
INTRAVENOUS | Status: AC
  Filled 2017-03-08: qty 10

## 2017-03-08 MED ORDER — FLEET ENEMA 7-19 GM/118ML RE ENEM: 1.0000 | ENEMA | Freq: Once | RECTAL | Status: DC | PRN

## 2017-03-08 MED ORDER — EPHEDRINE SULFATE 50 MG/ML IJ SOLN
INTRAMUSCULAR | Status: DC | PRN
  Administered 2017-03-08 (×3): 5 mg via INTRAVENOUS

## 2017-03-08 MED ORDER — DOCUSATE SODIUM 100 MG PO CAPS
100.0000 mg | ORAL_CAPSULE | Freq: Two times a day (BID) | ORAL | Status: DC
  Filled 2017-03-08 (×4): qty 1

## 2017-03-08 MED ORDER — BUPIVACAINE LIPOSOME 1.3 % IJ SUSP
INTRAMUSCULAR | Status: DC | PRN
  Administered 2017-03-08: 20 mL

## 2017-03-08 MED ORDER — BUPIVACAINE LIPOSOME 1.3 % IJ SUSP
20.0000 mL | Freq: Once | INTRAMUSCULAR | Status: DC
  Filled 2017-03-08: qty 20

## 2017-03-08 MED ORDER — GABAPENTIN 300 MG PO CAPS
ORAL_CAPSULE | ORAL | Status: AC
  Administered 2017-03-08: 300 mg via ORAL
  Filled 2017-03-08: qty 1

## 2017-03-08 MED ORDER — ONDANSETRON HCL 4 MG/2ML IJ SOLN
INTRAMUSCULAR | Status: AC
  Filled 2017-03-08: qty 2

## 2017-03-08 MED ORDER — SODIUM CHLORIDE 0.9 % IJ SOLN
INTRAMUSCULAR | Status: AC
  Filled 2017-03-08: qty 10

## 2017-03-08 MED ORDER — SODIUM CHLORIDE 0.9 % IJ SOLN
INTRAMUSCULAR | Status: DC | PRN
  Administered 2017-03-08: 60 mL

## 2017-03-08 MED ORDER — POLYETHYLENE GLYCOL 3350 17 G PO PACK: 17.0000 g | PACK | Freq: Every day | ORAL | Status: DC | PRN

## 2017-03-08 MED ORDER — MIDAZOLAM HCL 5 MG/5ML IJ SOLN
INTRAMUSCULAR | Status: DC | PRN
  Administered 2017-03-08 (×2): 1 mg via INTRAVENOUS

## 2017-03-08 MED ORDER — TRANEXAMIC ACID 1000 MG/10ML IV SOLN
1000.0000 mg | INTRAVENOUS | Status: AC
  Administered 2017-03-08: 1000 mg via INTRAVENOUS
  Filled 2017-03-08: qty 1100

## 2017-03-08 MED ORDER — PROPOFOL 10 MG/ML IV BOLUS
INTRAVENOUS | Status: AC
  Filled 2017-03-08: qty 40

## 2017-03-08 MED ORDER — METHOCARBAMOL 500 MG PO TABS
500.0000 mg | ORAL_TABLET | Freq: Four times a day (QID) | ORAL | Status: DC | PRN
  Administered 2017-03-08 – 2017-03-10 (×5): 500 mg via ORAL
  Filled 2017-03-08 (×5): qty 1

## 2017-03-08 MED ORDER — ONDANSETRON HCL 4 MG PO TABS
4.0000 mg | ORAL_TABLET | Freq: Four times a day (QID) | ORAL | Status: DC | PRN
  Administered 2017-03-09 – 2017-03-10 (×2): 4 mg via ORAL
  Filled 2017-03-08: qty 1

## 2017-03-08 MED ORDER — ONDANSETRON HCL 4 MG/2ML IJ SOLN
INTRAMUSCULAR | Status: DC | PRN
  Administered 2017-03-08: 4 mg via INTRAVENOUS

## 2017-03-08 MED ORDER — ACETAMINOPHEN 650 MG RE SUPP: 650.0000 mg | Freq: Four times a day (QID) | RECTAL | Status: DC | PRN

## 2017-03-08 MED ORDER — METOCLOPRAMIDE HCL 5 MG/ML IJ SOLN: 5.0000 mg | Freq: Three times a day (TID) | INTRAMUSCULAR | Status: DC | PRN

## 2017-03-08 MED ORDER — DEXAMETHASONE SODIUM PHOSPHATE 10 MG/ML IJ SOLN
INTRAMUSCULAR | Status: AC
  Filled 2017-03-08: qty 1

## 2017-03-08 MED ORDER — CEFAZOLIN SODIUM-DEXTROSE 2-4 GM/100ML-% IV SOLN
INTRAVENOUS | Status: AC
  Filled 2017-03-08: qty 100

## 2017-03-08 MED ORDER — BISACODYL 10 MG RE SUPP: 10.0000 mg | Freq: Every day | RECTAL | Status: DC | PRN

## 2017-03-08 MED ORDER — SODIUM CHLORIDE 0.9 % IJ SOLN
INTRAMUSCULAR | Status: AC
  Filled 2017-03-08: qty 50

## 2017-03-08 MED ORDER — PROPOFOL 10 MG/ML IV BOLUS
INTRAVENOUS | Status: AC
  Filled 2017-03-08: qty 20

## 2017-03-08 MED ORDER — PHENYLEPHRINE HCL 10 MG/ML IJ SOLN
INTRAMUSCULAR | Status: DC | PRN
  Administered 2017-03-08 (×5): 40 ug via INTRAVENOUS

## 2017-03-08 MED ORDER — CEFAZOLIN SODIUM-DEXTROSE 2-4 GM/100ML-% IV SOLN
2.0000 g | INTRAVENOUS | Status: AC
  Administered 2017-03-08: 2 g via INTRAVENOUS

## 2017-03-08 MED ORDER — FENTANYL CITRATE (PF) 100 MCG/2ML IJ SOLN
INTRAMUSCULAR | Status: AC
  Filled 2017-03-08: qty 2

## 2017-03-08 MED ORDER — METOCLOPRAMIDE HCL 5 MG/ML IJ SOLN: 10.0000 mg | Freq: Once | INTRAMUSCULAR | Status: DC | PRN

## 2017-03-08 MED ORDER — MENTHOL 3 MG MT LOZG: 1.0000 | LOZENGE | OROMUCOSAL | Status: DC | PRN

## 2017-03-08 MED ORDER — TRANEXAMIC ACID 1000 MG/10ML IV SOLN
1000.0000 mg | Freq: Once | INTRAVENOUS | Status: AC
  Administered 2017-03-08: 16:00:00 1000 mg via INTRAVENOUS
  Filled 2017-03-08: qty 1100

## 2017-03-08 MED ORDER — CHLORHEXIDINE GLUCONATE 4 % EX LIQD: 60.0000 mL | Freq: Once | CUTANEOUS | Status: DC

## 2017-03-08 MED ORDER — MORPHINE SULFATE (PF) 4 MG/ML IV SOLN
1.0000 mg | INTRAVENOUS | Status: DC | PRN
  Administered 2017-03-08: 18:00:00 1 mg via INTRAVENOUS
  Filled 2017-03-08: qty 1

## 2017-03-08 MED ORDER — ACETAMINOPHEN 10 MG/ML IV SOLN
INTRAVENOUS | Status: AC
  Filled 2017-03-08: qty 100

## 2017-03-08 MED ORDER — PHENOL 1.4 % MT LIQD: 1.0000 | OROMUCOSAL | Status: DC | PRN

## 2017-03-08 MED ORDER — MIDAZOLAM HCL 2 MG/2ML IJ SOLN
2.0000 mg | Freq: Once | INTRAMUSCULAR | Status: AC
  Administered 2017-03-08: 1 mg via INTRAVENOUS

## 2017-03-08 MED ORDER — DEXAMETHASONE SODIUM PHOSPHATE 10 MG/ML IJ SOLN: 10.0000 mg | Freq: Once | INTRAMUSCULAR | Status: DC

## 2017-03-08 MED ORDER — PROPOFOL 500 MG/50ML IV EMUL
INTRAVENOUS | Status: DC | PRN
  Administered 2017-03-08: 50 ug/kg/min via INTRAVENOUS

## 2017-03-08 MED ORDER — ACETAMINOPHEN 500 MG PO TABS
1000.0000 mg | ORAL_TABLET | Freq: Four times a day (QID) | ORAL | Status: AC
  Administered 2017-03-08 – 2017-03-09 (×4): 1000 mg via ORAL
  Filled 2017-03-08 (×4): qty 2

## 2017-03-08 MED ORDER — RIVAROXABAN 10 MG PO TABS
10.0000 mg | ORAL_TABLET | Freq: Every day | ORAL | Status: DC
  Administered 2017-03-09 – 2017-03-10 (×2): 10 mg via ORAL
  Filled 2017-03-08 (×2): qty 1

## 2017-03-08 MED ORDER — FENTANYL CITRATE (PF) 100 MCG/2ML IJ SOLN
INTRAMUSCULAR | Status: AC
  Administered 2017-03-08: 50 ug via INTRAVENOUS
  Filled 2017-03-08: qty 2

## 2017-03-08 MED ORDER — MIDAZOLAM HCL 2 MG/2ML IJ SOLN
INTRAMUSCULAR | Status: AC
  Filled 2017-03-08: qty 2

## 2017-03-08 MED ORDER — LACTATED RINGERS IV SOLN
INTRAVENOUS | Status: DC
  Administered 2017-03-08 (×2): via INTRAVENOUS

## 2017-03-08 MED ORDER — SODIUM CHLORIDE 0.9 % IR SOLN
Status: DC | PRN
  Administered 2017-03-08: 1000 mL

## 2017-03-08 MED ORDER — METHOCARBAMOL 1000 MG/10ML IJ SOLN
500.0000 mg | Freq: Four times a day (QID) | INTRAVENOUS | Status: DC | PRN
  Filled 2017-03-08: qty 5

## 2017-03-08 MED ORDER — LIDOCAINE 2% (20 MG/ML) 5 ML SYRINGE
INTRAMUSCULAR | Status: AC
  Filled 2017-03-08: qty 5

## 2017-03-08 MED ORDER — FENTANYL CITRATE (PF) 100 MCG/2ML IJ SOLN
25.0000 ug | INTRAMUSCULAR | Status: DC | PRN
Start: 2017-03-08 — End: 2017-03-08

## 2017-03-08 MED ORDER — GABAPENTIN 300 MG PO CAPS
300.0000 mg | ORAL_CAPSULE | Freq: Once | ORAL | Status: AC
  Administered 2017-03-08: 300 mg via ORAL

## 2017-03-08 MED ORDER — BUPIVACAINE-EPINEPHRINE (PF) 0.5% -1:200000 IJ SOLN
INTRAMUSCULAR | Status: DC | PRN
  Administered 2017-03-08: 30 mL via PERINEURAL

## 2017-03-08 MED ORDER — BUPIVACAINE IN DEXTROSE 0.75-8.25 % IT SOLN
INTRATHECAL | Status: DC | PRN
Start: 2017-03-08 — End: 2017-03-08

## 2017-03-08 MED ORDER — BUPIVACAINE IN DEXTROSE 0.75-8.25 % IT SOLN
INTRATHECAL | Status: DC | PRN
  Administered 2017-03-08: 1.8 mL via INTRATHECAL

## 2017-03-08 MED ORDER — ONDANSETRON HCL 4 MG/2ML IJ SOLN
4.0000 mg | Freq: Four times a day (QID) | INTRAMUSCULAR | Status: DC | PRN
  Filled 2017-03-08: qty 2

## 2017-03-08 MED ORDER — ACETAMINOPHEN 10 MG/ML IV SOLN: 1000.0000 mg | Freq: Once | INTRAVENOUS | Status: DC

## 2017-03-08 MED ORDER — MIDAZOLAM HCL 2 MG/2ML IJ SOLN
INTRAMUSCULAR | Status: AC
  Administered 2017-03-08: 1 mg via INTRAVENOUS
  Filled 2017-03-08: qty 2

## 2017-03-08 MED ORDER — MEPERIDINE HCL 50 MG/ML IJ SOLN: 6.2500 mg | INTRAMUSCULAR | Status: DC | PRN

## 2017-03-08 SURGICAL SUPPLY — 52 items
BAG DECANTER FOR FLEXI CONT (MISCELLANEOUS) ×3 IMPLANT
BAG ZIPLOCK 12X15 (MISCELLANEOUS) ×3 IMPLANT
BANDAGE ACE 6X5 VEL STRL LF (GAUZE/BANDAGES/DRESSINGS) ×3 IMPLANT
BANDAGE ELASTIC 6 VELCRO ST LF (GAUZE/BANDAGES/DRESSINGS) ×3 IMPLANT
BLADE SAG 18X100X1.27 (BLADE) ×3 IMPLANT
BLADE SAW SGTL 11.0X1.19X90.0M (BLADE) ×3 IMPLANT
BOWL SMART MIX CTS (DISPOSABLE) ×3 IMPLANT
CAP KNEE TOTAL 3 SIGMA ×3 IMPLANT
CEMENT HV SMART SET (Cement) ×6 IMPLANT
CLOSURE WOUND 1/2 X4 (GAUZE/BANDAGES/DRESSINGS) ×1
COVER SURGICAL LIGHT HANDLE (MISCELLANEOUS) ×3 IMPLANT
CUFF TOURN SGL QUICK 34 (TOURNIQUET CUFF) ×2
CUFF TRNQT CYL 34X4X40X1 (TOURNIQUET CUFF) ×1 IMPLANT
DECANTER SPIKE VIAL GLASS SM (MISCELLANEOUS) ×3 IMPLANT
DRAPE U-SHAPE 47X51 STRL (DRAPES) ×3 IMPLANT
DRSG ADAPTIC 3X8 NADH LF (GAUZE/BANDAGES/DRESSINGS) ×3 IMPLANT
DRSG PAD ABDOMINAL 8X10 ST (GAUZE/BANDAGES/DRESSINGS) ×3 IMPLANT
DURAPREP 26ML APPLICATOR (WOUND CARE) ×3 IMPLANT
ELECT REM PT RETURN 15FT ADLT (MISCELLANEOUS) ×3 IMPLANT
EVACUATOR 1/8 PVC DRAIN (DRAIN) ×3 IMPLANT
GAUZE SPONGE 4X4 12PLY STRL (GAUZE/BANDAGES/DRESSINGS) ×3 IMPLANT
GLOVE BIO SURGEON STRL SZ7.5 (GLOVE) IMPLANT
GLOVE BIO SURGEON STRL SZ8 (GLOVE) ×3 IMPLANT
GLOVE BIOGEL PI IND STRL 6.5 (GLOVE) IMPLANT
GLOVE BIOGEL PI IND STRL 8 (GLOVE) ×1 IMPLANT
GLOVE BIOGEL PI INDICATOR 6.5 (GLOVE)
GLOVE BIOGEL PI INDICATOR 8 (GLOVE) ×2
GLOVE SURG SS PI 6.5 STRL IVOR (GLOVE) IMPLANT
GOWN STRL REUS W/TWL LRG LVL3 (GOWN DISPOSABLE) ×3 IMPLANT
GOWN STRL REUS W/TWL XL LVL3 (GOWN DISPOSABLE) IMPLANT
HANDPIECE INTERPULSE COAX TIP (DISPOSABLE) ×2
IMMOBILIZER KNEE 20 (SOFTGOODS) ×3
IMMOBILIZER KNEE 20 THIGH 36 (SOFTGOODS) ×1 IMPLANT
MANIFOLD NEPTUNE II (INSTRUMENTS) ×3 IMPLANT
NS IRRIG 1000ML POUR BTL (IV SOLUTION) ×3 IMPLANT
PACK TOTAL KNEE CUSTOM (KITS) ×3 IMPLANT
PAD ABD 8X10 STRL (GAUZE/BANDAGES/DRESSINGS) ×3 IMPLANT
PADDING CAST COTTON 6X4 STRL (CAST SUPPLIES) ×3 IMPLANT
POSITIONER SURGICAL ARM (MISCELLANEOUS) ×3 IMPLANT
SET HNDPC FAN SPRY TIP SCT (DISPOSABLE) ×1 IMPLANT
STRIP CLOSURE SKIN 1/2X4 (GAUZE/BANDAGES/DRESSINGS) ×2 IMPLANT
SUT MNCRL AB 4-0 PS2 18 (SUTURE) ×3 IMPLANT
SUT STRATAFIX 0 PDS 27 VIOLET (SUTURE) ×3
SUT VIC AB 2-0 CT1 27 (SUTURE) ×6
SUT VIC AB 2-0 CT1 TAPERPNT 27 (SUTURE) ×3 IMPLANT
SUTURE STRATFX 0 PDS 27 VIOLET (SUTURE) ×1 IMPLANT
SYR 30ML LL (SYRINGE) ×6 IMPLANT
TRAY FOLEY W/METER SILVER 14FR (SET/KITS/TRAYS/PACK) ×3 IMPLANT
TRAY FOLEY W/METER SILVER 16FR (SET/KITS/TRAYS/PACK) IMPLANT
WATER STERILE IRR 1000ML POUR (IV SOLUTION) ×6 IMPLANT
WRAP KNEE MAXI GEL POST OP (GAUZE/BANDAGES/DRESSINGS) ×3 IMPLANT
YANKAUER SUCT BULB TIP 10FT TU (MISCELLANEOUS) ×3 IMPLANT

## 2017-03-08 NOTE — Discharge Instructions (Addendum)
°  ° °Dr. Frank Aluisio °Total Joint Specialist °[facility] °[address] °[place], [place] [address] °[phone] ° °TOTAL KNEE REPLACEMENT POSTOPERATIVE DIRECTIONS ° °Knee Rehabilitation, Guidelines Following Surgery  °Results after knee surgery are often greatly improved when you follow the exercise, range of motion and muscle strengthening exercises prescribed by your doctor. Safety measures are also important to protect the knee from further injury. Any time any of these exercises cause you to have increased pain or swelling in your knee joint, decrease the amount until you are comfortable again and slowly increase them. If you have problems or questions, call your caregiver or physical therapist for advice.  ° °HOME CARE INSTRUCTIONS  °Remove items at home which could result in a fall. This includes throw rugs or furniture in walking pathways.  °· ICE to the affected knee every three hours for 30 minutes at a time and then as needed for pain and swelling.  Continue to use ice on the knee for pain and swelling from surgery. You may notice swelling that will progress down to the foot and ankle.  This is normal after surgery.  Elevate the leg when you are not up walking on it.   °· Continue to use the breathing machine which will help keep your temperature down.  It is common for your temperature to cycle up and down following surgery, especially at night when you are not up moving around and exerting yourself.  The breathing machine keeps your lungs expanded and your temperature down. °· Do not place pillow under knee, focus on keeping the knee straight while resting ° °DIET °You may resume your previous home diet once your are discharged from the hospital. ° °DRESSING / WOUND CARE / SHOWERING °You may shower 3 days after surgery, but keep the wounds dry during showering.  You may use an occlusive plastic wrap (Press'n Seal for example), NO SOAKING/SUBMERGING IN THE BATHTUB.  If the  bandage gets wet, change with a clean dry gauze.  If the incision gets wet, pat the wound dry with a clean towel. °You may start showering once you are discharged home but do not submerge the incision under water. Just pat the incision dry and apply a dry gauze dressing on daily. °Change the surgical dressing daily and reapply a dry dressing each time. ° °ACTIVITY °Walk with your walker as instructed. °Use walker as long as suggested by your caregivers. °Avoid periods of inactivity such as sitting longer than an hour when not asleep. This helps prevent blood clots.  °You may resume a sexual relationship in one month or when given the OK by your doctor.  °You may return to work once you are cleared by your doctor.  °Do not drive a car for 6 weeks or until released by you surgeon.  °Do not drive while taking narcotics. ° °WEIGHT BEARING °Weight bearing as tolerated with assist device (walker, cane, etc) as directed, use it as long as suggested by your surgeon or therapist, typically at least 4-6 weeks. ° °POSTOPERATIVE CONSTIPATION PROTOCOL °Constipation - defined medically as fewer than three stools per week and severe constipation as less than one stool per week. ° °One of the most common issues patients have following surgery is constipation.  Even if you have a regular bowel pattern at home, your normal regimen is likely to be disrupted due to multiple reasons following surgery.  Combination of anesthesia, postoperative narcotics, change in appetite and fluid intake all can affect your   bowels.  In order to avoid complications following surgery, here are some recommendations in order to help you during your recovery period. ° °Colace (docusate) - Pick up an over-the-counter form of Colace or another stool softener and take twice a day as long as you are requiring postoperative pain medications.  Take with a full glass of water daily.  If you experience loose stools or diarrhea, hold the colace until you stool forms  back up.  If your symptoms do not get better within 1 week or if they get worse, check with your doctor. ° °Dulcolax (bisacodyl) - Pick up over-the-counter and take as directed by the product packaging as needed to assist with the movement of your bowels.  Take with a full glass of water.  Use this product as needed if not relieved by Colace only.  ° °MiraLax (polyethylene glycol) - Pick up over-the-counter to have on hand.  MiraLax is a solution that will increase the amount of water in your bowels to assist with bowel movements.  Take as directed and can mix with a glass of water, juice, soda, coffee, or tea.  Take if you go more than two days without a movement. °Do not use MiraLax more than once per day. Call your doctor if you are still constipated or irregular after using this medication for 7 days in a row. ° °If you continue to have problems with postoperative constipation, please contact the office for further assistance and recommendations.  If you experience "the worst abdominal pain ever" or develop nausea or vomiting, please contact the office immediatly for further recommendations for treatment. ° °ITCHING ° If you experience itching with your medications, try taking only a single pain pill, or even half a pain pill at a time.  You can also use Benadryl over the counter for itching or also to help with sleep.  ° °TED HOSE STOCKINGS °Wear the elastic stockings on both legs for three weeks following surgery during the day but you may remove then at night for sleeping. ° °MEDICATIONS °See your medication summary on the “After Visit Summary” that the nursing staff will review with you prior to discharge.  You may have some home medications which will be placed on hold until you complete the course of blood thinner medication.  It is important for you to complete the blood thinner medication as prescribed by your surgeon.  Continue your approved medications as instructed at time of  discharge. ° °PRECAUTIONS °If you experience chest pain or shortness of breath - call 911 immediately for transfer to the hospital emergency department.  °If you develop a fever greater that 101 F, purulent drainage from wound, increased redness or drainage from wound, foul odor from the wound/dressing, or calf pain - CONTACT YOUR SURGEON.   °                                                °FOLLOW-UP APPOINTMENTS °Make sure you keep all of your appointments after your operation with your surgeon and caregivers. You should call the office at the above phone number and make an appointment for approximately two weeks after the date of your surgery or on the date instructed by your surgeon outlined in the "After Visit Summary". ° ° °RANGE OF MOTION AND STRENGTHENING EXERCISES  °Rehabilitation of the knee is important following a knee   injury or an operation. After just a few days of immobilization, the muscles of the thigh which control the knee become weakened and shrink (atrophy). Knee exercises are designed to build up the tone and strength of the thigh muscles and to improve knee motion. Often times heat used for twenty to thirty minutes before working out will loosen up your tissues and help with improving the range of motion but do not use heat for the first two weeks following surgery. These exercises can be done on a training (exercise) mat, on the floor, on a table or on a bed. Use what ever works the best and is most comfortable for you Knee exercises include:  °Leg Lifts - While your knee is still immobilized in a splint or cast, you can do straight leg raises. Lift the leg to 60 degrees, hold for 3 sec, and slowly lower the leg. Repeat 10-20 times 2-3 times daily. Perform this exercise against resistance later as your knee gets better.  °Quad and Hamstring Sets - Tighten up the muscle on the front of the thigh (Quad) and hold for 5-10 sec. Repeat this 10-20 times hourly. Hamstring sets are done by pushing the  foot backward against an object and holding for 5-10 sec. Repeat as with quad sets.  °· Leg Slides: Lying on your back, slowly slide your foot toward your buttocks, bending your knee up off the floor (only go as far as is comfortable). Then slowly slide your foot back down until your leg is flat on the floor again. °· Angel Wings: Lying on your back spread your legs to the side as far apart as you can without causing discomfort.  °A rehabilitation program following serious knee injuries can speed recovery and prevent re-injury in the future due to weakened muscles. Contact your doctor or a physical therapist for more information on knee rehabilitation.  ° °IF YOU ARE TRANSFERRED TO A SKILLED REHAB FACILITY °If the patient is transferred to a skilled rehab facility following release from the hospital, a list of the current medications will be sent to the facility for the patient to continue.  When discharged from the skilled rehab facility, please have the facility set up the patient's Home Health Physical Therapy prior to being released. Also, the skilled facility will be responsible for providing the patient with their medications at time of release from the facility to include their pain medication, the muscle relaxants, and their blood thinner medication. If the patient is still at the rehab facility at time of the two week follow up appointment, the skilled rehab facility will also need to assist the patient in arranging follow up appointment in our office and any transportation needs. ° °MAKE SURE YOU:  °Understand these instructions.  °Get help right away if you are not doing well or get worse.  ° ° °Pick up stool softner and laxative for home use following surgery while on pain medications. °Do not submerge incision under water. °Please use good hand washing techniques while changing dressing each day. °May shower starting three days after surgery. °Please use a clean towel to pat the incision dry following  showers. °Continue to use ice for pain and swelling after surgery. °Do not use any lotions or creams on the incision until instructed by your surgeon. ° °Take Xarelto for two and a half more weeks following discharge from the hospital, then discontinue Xarelto. °Once the patient has completed the blood thinner regimen, then take a Baby 81 mg Aspirin daily   for three more weeks. ° ° °Information on my medicine - XARELTO® (Rivaroxaban) ° ° °Why was Xarelto® prescribed for you? °Xarelto® was prescribed for you to reduce the risk of blood clots forming after orthopedic surgery. The medical term for these abnormal blood clots is venous thromboembolism (VTE). ° °What do you need to know about xarelto® ? °Take your Xarelto® ONCE DAILY at the same time every day. °You may take it either with or without food. ° °If you have difficulty swallowing the tablet whole, you may crush it and mix in applesauce just prior to taking your dose. ° °Take Xarelto® exactly as prescribed by your doctor and DO NOT stop taking Xarelto® without talking to the doctor who prescribed the medication.  Stopping without other VTE prevention medication to take the place of Xarelto® may increase your risk of developing a clot. ° °After discharge, you should have regular check-up appointments with your healthcare provider that is prescribing your Xarelto®.   ° °What do you do if you miss a dose? °If you miss a dose, take it as soon as you remember on the same day then continue your regularly scheduled once daily regimen the next day. Do not take two doses of Xarelto® on the same day.  ° °Important Safety Information °A possible side effect of Xarelto® is bleeding. You should call your healthcare provider right away if you experience any of the following: °? Bleeding from an injury or your nose that does not stop. °? Unusual colored urine (red or dark brown) or unusual colored stools (red or black). °? Unusual bruising for unknown reasons. °? A serious  fall or if you hit your head (even if there is no bleeding). ° °Some medicines may interact with Xarelto® and might increase your risk of bleeding while on Xarelto®. To help avoid this, consult your healthcare provider or pharmacist prior to using any new prescription or non-prescription medications, including herbals, vitamins, non-steroidal anti-inflammatory drugs (NSAIDs) and supplements. ° °This website has more information on Xarelto®: www.xarelto.com. ° ° °

## 2017-03-08 NOTE — Anesthesia Procedure Notes (Signed)
Spinal  Staffing Anesthesiologist: Phillips Grout Performed: anesthesiologist  Preanesthetic Checklist Completed: patient identified, site marked, surgical consent, pre-op evaluation, timeout performed, IV checked, risks and benefits discussed and monitors and equipment checked Spinal Block Patient position: sitting Prep: Betadine and site prepped and draped Patient monitoring: heart rate, continuous pulse ox and blood pressure Approach: midline Location: L2-3 Injection technique: single-shot Needle Needle type: Spinocan  Needle gauge: 22 G Needle length: 9 cm

## 2017-03-08 NOTE — Anesthesia Preprocedure Evaluation (Signed)
Anesthesia Evaluation  Patient identified by MRN, date of birth, ID band Patient awake    Reviewed: Allergy & Precautions, NPO status , Patient's Chart, lab work & pertinent test results  History of Anesthesia Complications (+) PONV  Airway Mallampati: II  TM Distance: >3 FB Neck ROM: Full    Dental no notable dental hx.    Pulmonary former smoker,    Pulmonary exam normal breath sounds clear to auscultation       Cardiovascular negative cardio ROS Normal cardiovascular exam Rhythm:Regular Rate:Normal     Neuro/Psych negative neurological ROS  negative psych ROS   GI/Hepatic negative GI ROS, Neg liver ROS,   Endo/Other  negative endocrine ROS  Renal/GU negative Renal ROS  negative genitourinary   Musculoskeletal negative musculoskeletal ROS (+)   Abdominal   Peds negative pediatric ROS (+)  Hematology negative hematology ROS (+)   Anesthesia Other Findings   Reproductive/Obstetrics negative OB ROS                            Anesthesia Physical Anesthesia Plan  ASA: II  Anesthesia Plan: Spinal   Post-op Pain Management:  Regional for Post-op pain   Induction: Intravenous  PONV Risk Score and Plan: 3 and Ondansetron, Dexamethasone and Midazolam  Airway Management Planned: Simple Face Mask  Additional Equipment:   Intra-op Plan:   Post-operative Plan:   Informed Consent: I have reviewed the patients History and Physical, chart, labs and discussed the procedure including the risks, benefits and alternatives for the proposed anesthesia with the patient or authorized representative who has indicated his/her understanding and acceptance.   Dental advisory given  Plan Discussed with: CRNA  Anesthesia Plan Comments:         Anesthesia Quick Evaluation

## 2017-03-08 NOTE — Progress Notes (Signed)
AssistedDr. Acey Lav with left, adductor canal block. Side rails up, monitors on throughout procedure. See vital signs in flow sheet. Tolerated Procedure well.

## 2017-03-08 NOTE — Progress Notes (Signed)
Orthopedic Tech Progress Note Patient Details:  Kara Schultz 15-Aug-1940 [id number]  CPM Left Knee CPM Left Knee: Off Left Knee Flexion (Degrees): 40 Left Knee Extension (Degrees): 10   Saul Fordyce 03/08/2017, 5:54 PM

## 2017-03-08 NOTE — Anesthesia Procedure Notes (Signed)
Spinal  Patient location during procedure: OR Staffing Anesthesiologist: Jillayne Witte Performed: anesthesiologist  Preanesthetic Checklist Completed: patient identified, site marked, surgical consent, pre-op evaluation, timeout performed, IV checked, risks and benefits discussed and monitors and equipment checked Spinal Block Patient position: sitting Prep: Betadine Patient monitoring: heart rate, continuous pulse ox and blood pressure Approach: right paramedian Location: L3-4 Injection technique: single-shot Needle Needle type: Spinocan  Needle gauge: 22 G Needle length: 9 cm Additional Notes Expiration date of kit checked and confirmed. Patient tolerated procedure well, without complications.       

## 2017-03-08 NOTE — Op Note (Signed)
OPERATIVE REPORT-TOTAL KNEE ARTHROPLASTY   Pre-operative diagnosis- Osteoarthritis  Left knee(s)  Post-operative diagnosis- Osteoarthritis Left knee(s)  Procedure-  Left  Total Knee Arthroplasty  Surgeon- Gus Rankin. Rashaud Ybarbo, MD  Assistant- Dimitri Ped, PA-C   Anesthesia-  Adductor canal block and spinal  EBL-* No blood loss amount entered *   Drains Hemovac  Tourniquet time-  Total Tourniquet Time Documented: Thigh (Left) - 32 minutes Total: Thigh (Left) - 32 minutes     Complications- None  Condition-PACU - hemodynamically stable.   Brief Clinical Note  Kara Schultz is a 76 y.o. year old female with end stage OA of her left knee with progressively worsening pain and dysfunction. She has constant pain, with activity and at rest and significant functional deficits with difficulties even with ADLs. She has had extensive non-op management including analgesics, injections of cortisone and viscosupplements, and home exercise program, but remains in significant pain with significant dysfunction. Radiographs show bone on bone arthritis lateral and patellofemoral. She presents now for left Total Knee Arthroplasty.    Procedure in detail---   The patient is brought into the operating room and positioned supine on the operating table. After successful administration of  Adductor canal block and spinal,   a tourniquet is placed high on the  Left thigh(s) and the lower extremity is prepped and draped in the usual sterile fashion. Time out is performed by the operating team and then the  Left lower extremity is wrapped in Esmarch, knee flexed and the tourniquet inflated to 300 mmHg.       A midline incision is made with a ten blade through the subcutaneous tissue to the level of the extensor mechanism. A fresh blade is used to make a medial parapatellar arthrotomy. Soft tissue over the proximal medial tibia is subperiosteally elevated to the joint line with a knife and into the  semimembranosus bursa with a Cobb elevator. Soft tissue over the proximal lateral tibia is elevated with attention being paid to avoiding the patellar tendon on the tibial tubercle. The patella is everted, knee flexed 90 degrees and the ACL and PCL are removed. Findings are bone on bone lateral and patellofemoral with exposed bone medial also.        The drill is used to create a starting hole in the distal femur and the canal is thoroughly irrigated with sterile saline to remove the fatty contents. The 5 degree Left  valgus alignment guide is placed into the femoral canal and the distal femoral cutting block is pinned to remove 10 mm off the distal femur. Resection is made with an oscillating saw.      The tibia is subluxed forward and the menisci are removed. The extramedullary alignment guide is placed referencing proximally at the medial aspect of the tibial tubercle and distally along the second metatarsal axis and tibial crest. The block is pinned to remove 2mm off the more deficient lateral  side. Resection is made with an oscillating saw. Size 3is the most appropriate size for the tibia and the proximal tibia is prepared with the modular drill and keel punch for that size.      The femoral sizing guide is placed and size 3 is most appropriate. Rotation is marked off the epicondylar axis and confirmed by creating a rectangular flexion gap at 90 degrees. The size 3 cutting block is pinned in this rotation and the anterior, posterior and chamfer cuts are made with the oscillating saw. The intercondylar block is then placed and  that cut is made.      Trial size 3 tibial component, trial size 3 posterior stabilized femur and a 10  mm posterior stabilized rotating platform insert trial is placed. Full extension is achieved with excellent varus/valgus and anterior/posterior balance throughout full range of motion. The patella is everted and thickness measured to be 22  mm. Free hand resection is taken to 12 mm,  a 35 template is placed, lug holes are drilled, trial patella is placed, and it tracks normally. Osteophytes are removed off the posterior femur with the trial in place. All trials are removed and the cut bone surfaces prepared with pulsatile lavage. Cement is mixed and once ready for implantation, the size 3 tibial implant, size  3 posterior stabilized femoral component, and the size 35 patella are cemented in place and the patella is held with the clamp. The trial insert is placed and the knee held in full extension. The Exparel (20 ml mixed with 60 ml saline) is injected into the extensor mechanism, posterior capsule, medial and lateral gutters and subcutaneous tissues.  All extruded cement is removed and once the cement is hard the permanent 10 mm posterior stabilized rotating platform insert is placed into the tibial tray.      The wound is copiously irrigated with saline solution and the extensor mechanism closed over a hemovac drain with #1 V-loc suture. The tourniquet is released for a total tourniquet time of 32  minutes. Flexion against gravity is 140 degrees and the patella tracks normally. Subcutaneous tissue is closed with 2.0 vicryl and subcuticular with running 4.0 Monocryl. The incision is cleaned and dried and steri-strips and a bulky sterile dressing are applied. The limb is placed into a knee immobilizer and the patient is awakened and transported to recovery in stable condition.      Please note that a surgical assistant was a medical necessity for this procedure in order to perform it in a safe and expeditious manner. Surgical assistant was necessary to retract the ligaments and vital neurovascular structures to prevent injury to them and also necessary for proper positioning of the limb to allow for anatomic placement of the prosthesis.   Gus Rankin Ephram Kornegay, MD    03/08/2017, 12:55 PM

## 2017-03-08 NOTE — Anesthesia Procedure Notes (Signed)
Anesthesia Regional Block: Adductor canal block   Pre-Anesthetic Checklist: ,, timeout performed, Correct Patient, Correct Site, Correct Laterality, Correct Procedure, Correct Position, site marked, Risks and benefits discussed,  Surgical consent,  Pre-op evaluation,  At surgeon's request and post-op pain management  Laterality: Left and Lower  Prep: Maximum Sterile Barrier Precautions used, chloraprep       Needles:  Injection technique: Single-shot  Needle Type: Echogenic Stimulator Needle     Needle Length: 10cm      Additional Needles:   Procedures: ultrasound guided,,,,,,,,  Narrative:  Start time: 03/08/2017 10:49 AM End time: 03/08/2017 10:54 AM Injection made incrementally with aspirations every 5 mL.  Performed by: Personally  Anesthesiologist: Phillips Grout  Additional Notes: Risks, benefits and alternative to block explained extensively.  Patient tolerated procedure well, without complications.

## 2017-03-08 NOTE — Transfer of Care (Signed)
Immediate Anesthesia Transfer of Care Note  Patient: Kara Schultz  Procedure(s) Performed: Procedure(s): LEFT TOTAL KNEE ARTHROPLASTY (Left)  Patient Location: PACU  Anesthesia Type:Spinal  Level of Consciousness: awake, oriented, drowsy and patient cooperative  Airway & Oxygen Therapy: Patient Spontanous Breathing and Patient connected to nasal cannula oxygen  Post-op Assessment: Report given to RN and Post -op Vital signs reviewed and stable  Post vital signs: Reviewed and stable  Last Vitals:  Vitals:   03/08/17 1130 03/08/17 1131  BP:    Pulse: 88 87  Resp: 14 13  Temp:      Last Pain:  Vitals:   03/08/17 0923  TempSrc: Oral         Complications: No apparent anesthesia complications

## 2017-03-08 NOTE — Anesthesia Postprocedure Evaluation (Signed)
Anesthesia Post Note  Patient: Kara Schultz  Procedure(s) Performed: Procedure(s) (LRB): LEFT TOTAL KNEE ARTHROPLASTY (Left)     Patient location during evaluation: PACU Anesthesia Type: Spinal and Regional Level of consciousness: awake and alert Pain management: pain level controlled Vital Signs Assessment: post-procedure vital signs reviewed and stable Respiratory status: spontaneous breathing and respiratory function stable Cardiovascular status: blood pressure returned to baseline and stable Postop Assessment: no headache, no backache and spinal receding Anesthetic complications: no    Last Vitals:  Vitals:   03/08/17 1628 03/08/17 1725  BP: (!) 157/96 (!) 144/84  Pulse: 81 80  Resp: 16 16  Temp: 36.5 C 36.4 C    Last Pain:  Vitals:   03/08/17 1731  TempSrc:   PainSc: 2                  Phillips Grout

## 2017-03-09 ENCOUNTER — Encounter (HOSPITAL_COMMUNITY): Payer: Self-pay | Admitting: Orthopedic Surgery

## 2017-03-09 LAB — CBC
HEMATOCRIT: 32.7 % — AB (ref 36.0–46.0)
HEMOGLOBIN: 10.6 g/dL — AB (ref 12.0–15.0)
MCH: 29.5 pg (ref 26.0–34.0)
MCHC: 32.4 g/dL (ref 30.0–36.0)
MCV: 91.1 fL (ref 78.0–100.0)
Platelets: 193 10*3/uL (ref 150–400)
RBC: 3.59 MIL/uL — ABNORMAL LOW (ref 3.87–5.11)
RDW: 13.6 % (ref 11.5–15.5)
WBC: 8.4 10*3/uL (ref 4.0–10.5)

## 2017-03-09 LAB — BASIC METABOLIC PANEL
ANION GAP: 5 (ref 5–15)
BUN: 7 mg/dL (ref 6–20)
CHLORIDE: 105 mmol/L (ref 101–111)
CO2: 29 mmol/L (ref 22–32)
Calcium: 8.5 mg/dL — ABNORMAL LOW (ref 8.9–10.3)
Creatinine, Ser: 0.7 mg/dL (ref 0.44–1.00)
GFR calc non Af Amer: >60 mL/min (ref >60)
Glucose, Bld: 112 mg/dL — ABNORMAL HIGH (ref 65–99)
Potassium: 4.2 mmol/L (ref 3.5–5.1)
Sodium: 139 mmol/L (ref 135–145)

## 2017-03-09 MED ORDER — PROMETHAZINE HCL 25 MG/ML IJ SOLN: 6.2500 mg | Freq: Four times a day (QID) | INTRAMUSCULAR | Status: DC | PRN

## 2017-03-09 MED ORDER — HYDROMORPHONE HCL 2 MG PO TABS
2.0000 mg | ORAL_TABLET | ORAL | Status: DC | PRN
  Administered 2017-03-10: 2 mg via ORAL
  Filled 2017-03-09: qty 1

## 2017-03-09 MED ORDER — TRAMADOL HCL 50 MG PO TABS
50.0000 mg | ORAL_TABLET | Freq: Four times a day (QID) | ORAL | Status: DC | PRN
  Administered 2017-03-10: 08:00:00 100 mg via ORAL
  Filled 2017-03-09: qty 2

## 2017-03-09 MED ORDER — GABAPENTIN 300 MG PO CAPS
300.0000 mg | ORAL_CAPSULE | Freq: Three times a day (TID) | ORAL | Status: DC
  Administered 2017-03-09 – 2017-03-10 (×4): 300 mg via ORAL
  Filled 2017-03-09 (×4): qty 1

## 2017-03-09 NOTE — Progress Notes (Signed)
Physical Therapy Treatment Patient Details Name: Kara Schultz MRN: [id number] DOB: 1940/08/13 Today's Date: 03/09/2017    History of Present Illness 76 yo female s/p L TKA 03/08/17    PT Comments    Pt c/o dizziness and not feeling well this afternoon. Decreased ambulation distance this pm. Assisted pt back to bed. Will continue to follow and progress activity as tolerated.    Follow Up Recommendations  DC plan and follow up therapy as arranged by surgeon ([facility])     Equipment Recommendations  None recommended by PT    Recommendations for Other Services       Precautions / Restrictions Precautions Precautions: Fall;Knee Required Braces or Orthoses: Knee Immobilizer - Left Knee Immobilizer - Left: Discontinue once straight leg raise with < 10 degree lag Restrictions Weight Bearing Restrictions: No LLE Weight Bearing: Weight bearing as tolerated    Mobility  Bed Mobility Overal bed mobility: Needs Assistance Bed Mobility: Supine to Sit;Sit to Supine     Supine to sit: Supervision;HOB elevated Sit to supine: Supervision;HOB elevated      Transfers Overall transfer level: Needs assistance Equipment used: Rolling walker (2 wheeled) Transfers: Sit to/from Stand Sit to Stand: Min guard         General transfer comment: close guard for safety. VCs safety, hand/LE placement  Ambulation/Gait Ambulation/Gait assistance: Min assist Ambulation Distance (Feet): 50 Feet Assistive device: Rolling walker (2 wheeled) Gait Pattern/deviations: Step-to pattern;Trunk flexed     General Gait Details: Intermittent assist to steady. Pt c/o dizziness. Had NT bring recliner for pt. Pt was assisted back to room in recliner.    Stairs            Wheelchair Mobility    Modified Rankin (Stroke Patients Only)       Balance                                            Cognition Arousal/Alertness: Awake/alert Behavior During Therapy: WFL for tasks  assessed/performed Overall Cognitive Status: Within Functional Limits for tasks assessed                                        Exercises   General Comments        Pertinent Vitals/Pain Pain Assessment: 0-10 Pain Score: 5  Pain Location: L knee with activity Pain Descriptors / Indicators: Aching;Sore Pain Intervention(s): Monitored during session;Repositioned    Home Living                      Prior Function            PT Goals (current goals can now be found in the care plan section) Acute Rehab PT Goals Patient Stated Goal: regain ind. feel better (experiencing nausea) PT Goal Formulation: With patient Time For Goal Achievement: 03/23/17 Potential to Achieve Goals: Good Progress towards PT goals: Progressing toward goals    Frequency    7X/week      PT Plan Current plan remains appropriate    Co-evaluation              AM-PAC PT "6 Clicks" Daily Activity  Outcome Measure  Difficulty turning over in bed (including adjusting bedclothes, sheets and blankets)?: A Little Difficulty moving from lying on back to sitting  on the side of the bed? : A Little Difficulty sitting down on and standing up from a chair with arms (e.g., wheelchair, bedside commode, etc,.)?: A Little Help needed moving to and from a bed to chair (including a wheelchair)?: A Little Help needed walking in hospital room?: A Little Help needed climbing 3-5 steps with a railing? : A Little 6 Click Score: 18    End of Session Equipment Utilized During Treatment: Gait belt;Left knee immobilizer Activity Tolerance:  (Limited by dizziness. Pt c/o not feeling well. ) Patient left: in bed;with call bell/phone within reach   PT Visit Diagnosis: Muscle weakness (generalized) (M62.81);Difficulty in walking, not elsewhere classified (R26.2)     Time: 4098-1191 PT Time Calculation (min) (ACUTE ONLY): 11 min  Charges:  $Gait Training: 8-22 mins                    G  Codes:          Rebeca Alert, MPT Pager: [phone]

## 2017-03-09 NOTE — Progress Notes (Signed)
Orthopedic Tech Progress Note Patient Details:  Kara Schultz 02/21/1941 [id number]  CPM Left Knee CPM Left Knee: Off Left Knee Flexion (Degrees): 40 Left Knee Extension (Degrees): 10   Saul Fordyce 03/09/2017, 4:53 PM

## 2017-03-09 NOTE — Addendum Note (Signed)
Addendum  created 03/09/17 0650 by Carthel Castille J, CRNA   Charge Capture section accepted    

## 2017-03-09 NOTE — Progress Notes (Signed)
Orthopedic Tech Progress Note Patient Details:  Kara Schultz 02/22/1941 [id number]  CPM Left Knee CPM Left Knee: Off Left Knee Flexion (Degrees): 40 Left Knee Extension (Degrees): 10   Saul Fordyce 03/09/2017, 8:12 PM

## 2017-03-09 NOTE — Evaluation (Signed)
Physical Therapy Evaluation Patient Details Name: Kara Schultz MRN: [id number] DOB: 10/16/1940 Today's Date: 03/09/2017   History of Present Illness  76 yo female s/p L TKA 03/08/17  Clinical Impression  On eval, pt was Min guard assist for mobility. She walked ~60 feet with a RW. Minimal pain with activity. Pt is not feeling all that well this am due to nausea. Will follow and progress activity as tolerated.     Follow Up Recommendations DC plan and follow up therapy as arranged by surgeon ([facility])    Equipment Recommendations  None recommended by PT    Recommendations for Other Services       Precautions / Restrictions Precautions Precautions: Fall;Knee Required Braces or Orthoses: Knee Immobilizer - Left Knee Immobilizer - Left: Discontinue once straight leg raise with < 10 degree lag Restrictions Weight Bearing Restrictions: No LLE Weight Bearing: Weight bearing as tolerated      Mobility  Bed Mobility Overal bed mobility: Needs Assistance Bed Mobility: Supine to Sit     Supine to sit: Supervision;HOB elevated        Transfers Overall transfer level: Needs assistance Equipment used: Rolling walker (2 wheeled) Transfers: Sit to/from Stand Sit to Stand: Min guard         General transfer comment: close guard for safety. VCs safety, hand/LE placement  Ambulation/Gait Ambulation/Gait assistance: Min guard Ambulation Distance (Feet): 60 Feet Assistive device: Rolling walker (2 wheeled) Gait Pattern/deviations: Step-to pattern     General Gait Details: close guard for safety. VCs sequence.   Stairs            Wheelchair Mobility    Modified Rankin (Stroke Patients Only)       Balance                                             Pertinent Vitals/Pain Pain Assessment: 0-10 Pain Score: 4  Pain Location: L knee with activity Pain Descriptors / Indicators: Aching;Sore Pain Intervention(s): Monitored during  session;Repositioned;Ice applied    Home Living Family/patient expects to be discharged to:: Private residence Living Arrangements: Spouse/significant other Available Help at Discharge: Family Type of Home: House Home Access: Stairs to enter Entrance Stairs-Rails: None Entrance Stairs-Number of Steps: 1 small step Home Layout: One level Home Equipment: Environmental consultant - 2 wheels;Shower seat - built in      Prior Function Level of Independence: Independent               Higher education careers adviser        Extremity/Trunk Assessment   Upper Extremity Assessment Upper Extremity Assessment: Defer to OT evaluation    Lower Extremity Assessment Lower Extremity Assessment: Generalized weakness (s/p L TKA)    Cervical / Trunk Assessment Cervical / Trunk Assessment: Normal  Communication   Communication: No difficulties  Cognition Arousal/Alertness: Awake/alert Behavior During Therapy: WFL for tasks assessed/performed Overall Cognitive Status: Within Functional Limits for tasks assessed                                        General Comments      Exercises Total Joint Exercises Ankle Circles/Pumps: AROM;Both;10 reps;Supine Quad Sets: AROM;Both;10 reps;Supine Hip ABduction/ADduction: AROM;Left;10 reps;Supine Straight Leg Raises: AROM;Left;10 reps;Supine Goniometric ROM: ~15 degrees knee extension   Assessment/Plan    PT  Assessment Patient needs continued PT services  PT Problem List Decreased strength;Decreased mobility;Decreased range of motion;Decreased activity tolerance;Pain;Decreased knowledge of use of DME;Decreased balance;Decreased knowledge of precautions       PT Treatment Interventions DME instruction;Therapeutic activities;Gait training;Therapeutic exercise;Patient/family education;Balance training;Stair training;Functional mobility training    PT Goals (Current goals can be found in the Care Plan section)  Acute Rehab PT Goals Patient Stated Goal:  regain ind. feel better (experiencing nausea) PT Goal Formulation: With patient Time For Goal Achievement: 03/23/17 Potential to Achieve Goals: Good    Frequency 7X/week   Barriers to discharge        Co-evaluation               AM-PAC PT "6 Clicks" Daily Activity  Outcome Measure Difficulty turning over in bed (including adjusting bedclothes, sheets and blankets)?: A Little Difficulty moving from lying on back to sitting on the side of the bed? : A Little Difficulty sitting down on and standing up from a chair with arms (e.g., wheelchair, bedside commode, etc,.)?: A Little Help needed moving to and from a bed to chair (including a wheelchair)?: A Little Help needed walking in hospital room?: A Little Help needed climbing 3-5 steps with a railing? : A Little 6 Click Score: 18    End of Session Equipment Utilized During Treatment: Gait belt;Left knee immobilizer Activity Tolerance: Patient tolerated treatment well (having some nausea this am) Patient left: in chair;with call bell/phone within reach   PT Visit Diagnosis: Muscle weakness (generalized) (M62.81);Difficulty in walking, not elsewhere classified (R26.2)    Time: 1610-9604 PT Time Calculation (min) (ACUTE ONLY): 13 min   Charges:   PT Evaluation $PT Eval Low Complexity: 1 Low     PT G Codes:          Rebeca Alert, MPT Pager: [phone]

## 2017-03-09 NOTE — Progress Notes (Signed)
   Subjective: 1 Day Post-Op Procedure(s) (LRB): LEFT TOTAL KNEE ARTHROPLASTY (Left) Patient reports pain as mild.   Patient seen in rounds for Dr. Lequita Halt.   Doing very well this morning. Patient is well, but has had some minor complaints of pain in the knee, requiring pain medications We will start therapy today.  Plan is to go Home after hospital stay.  Objective: Vital signs in last 24 hours: Temp:  [97.6 F (36.4 C)-98.7 F (37.1 C)] 98.1 F (36.7 C) (07/31 1255) Pulse Rate:  [74-95] 95 (07/31 1255) Resp:  [14-16] 16 (07/31 1255) BP: (114-166)/(51-96) 117/53 (07/31 1255) SpO2:  [93 %-100 %] 93 % (07/31 1255)  Intake/Output from previous day:  Intake/Output Summary (Last 24 hours) at 03/09/17 1526 Last data filed at 03/09/17 1255  Gross per 24 hour  Intake             1720 ml  Output             1770 ml  Net              -50 ml    Intake/Output this shift: Total I/O In: 120 [P.O.:120] Out: 450 [Urine:450]  Labs:  Recent Labs  03/09/17 0541  HGB 10.6*    Recent Labs  03/09/17 0541  WBC 8.4  RBC 3.59*  HCT 32.7*  PLT 193    Recent Labs  03/09/17 0541  NA 139  K 4.2  CL 105  CO2 29  BUN 7  CREATININE 0.70  GLUCOSE 112*  CALCIUM 8.5*   No results for input(s): LABPT, INR in the last 72 hours.  EXAM General - Patient is Alert, Appropriate and Oriented Extremity - Neurovascular intact Sensation intact distally Intact pulses distally Dorsiflexion/Plantar flexion intact Dressing - dressing C/D/I Motor Function - intact, moving foot and toes well on exam.  Hemovac pulled without difficulty.  Past Medical History:  Diagnosis Date  . 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 Complication of anesthesia   . Crohn's disease (HCC)   . History of kidney stones   . PONV (postoperative nausea and vomiting)     Assessment/Plan: 1 Day Post-Op Procedure(s) (LRB): LEFT TOTAL KNEE ARTHROPLASTY (Left) Principal Problem:   Primary  osteoarthritis of left knee Active Problems:   OA (osteoarthritis) of knee  Estimated body mass index is 36.91 kg/m as calculated from the following:   Height as of this encounter: 5' (1.524 m).   Weight as of this encounter: 85.7 kg (189 lb). Advance diet Up with therapy Plan for discharge tomorrow Discharge home - No HHPT - In-Home [facility]  DVT Prophylaxis - Xarelto Weight-Bearing as tolerated to left leg D/C O2 and Pulse OX and try on Room Air  Avel Peace, PA-C Orthopaedic Surgery 03/09/2017, 3:26 PM

## 2017-03-09 NOTE — Progress Notes (Signed)
2Orthopedic Tech Progress Note Patient Details:  Kara Schultz 01/11/1941 [id number]  CPM Left Knee CPM Left Knee: On Left Knee Flexion (Degrees): 40 Left Knee Extension (Degrees): 10   Saul Fordyce 03/09/2017, 4:56 PM

## 2017-03-10 LAB — CBC
HEMATOCRIT: 33.2 % — AB (ref 36.0–46.0)
Hemoglobin: 10.5 g/dL — ABNORMAL LOW (ref 12.0–15.0)
MCH: 28.7 pg (ref 26.0–34.0)
MCHC: 31.6 g/dL (ref 30.0–36.0)
MCV: 90.7 fL (ref 78.0–100.0)
PLATELETS: 219 10*3/uL (ref 150–400)
RBC: 3.66 MIL/uL — ABNORMAL LOW (ref 3.87–5.11)
RDW: 13.9 % (ref 11.5–15.5)
WBC: 11.1 10*3/uL — ABNORMAL HIGH (ref 4.0–10.5)

## 2017-03-10 LAB — BASIC METABOLIC PANEL
Anion gap: 9 (ref 5–15)
BUN: 11 mg/dL (ref 6–20)
CALCIUM: 9 mg/dL (ref 8.9–10.3)
CO2: 28 mmol/L (ref 22–32)
CREATININE: 0.83 mg/dL (ref 0.44–1.00)
Chloride: 103 mmol/L (ref 101–111)
GFR calc non Af Amer: >60 mL/min (ref >60)
Glucose, Bld: 126 mg/dL — ABNORMAL HIGH (ref 65–99)
Potassium: 3.6 mmol/L (ref 3.5–5.1)
Sodium: 140 mmol/L (ref 135–145)

## 2017-03-10 MED ORDER — METHOCARBAMOL 500 MG PO TABS
500.0000 mg | ORAL_TABLET | Freq: Four times a day (QID) | ORAL | 0 refills | Status: AC | PRN
Start: 2017-03-10 — End: ?

## 2017-03-10 MED ORDER — RIVAROXABAN 10 MG PO TABS: 10.0000 mg | ORAL_TABLET | Freq: Every day | ORAL | 0 refills | Status: AC

## 2017-03-10 MED ORDER — HYDROMORPHONE HCL 2 MG PO TABS: 2.0000 mg | ORAL_TABLET | ORAL | 0 refills | Status: AC | PRN

## 2017-03-10 MED ORDER — TRAMADOL HCL 50 MG PO TABS: 50.0000 mg | ORAL_TABLET | Freq: Four times a day (QID) | ORAL | 0 refills | Status: AC | PRN

## 2017-03-10 MED ORDER — GABAPENTIN 300 MG PO CAPS: 300.0000 mg | ORAL_CAPSULE | Freq: Three times a day (TID) | ORAL | 0 refills | Status: AC

## 2017-03-10 NOTE — Discharge Summary (Signed)
Physician Discharge Summary   Patient ID: Kara Schultz MRN: [id number] DOB/AGE: 1941/01/15 76 y.o.  Admit date: 03/08/2017 Discharge date: 03/10/2017  Primary Diagnosis:  Osteoarthritis  Left knee(s)  Admission Diagnoses:  Past Medical History:  Diagnosis Date  . 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 Complication of anesthesia   . Crohn's disease ([place])   . History of kidney stones   . PONV (postoperative nausea and vomiting)    Discharge Diagnoses:   Principal Problem:   Primary osteoarthritis of left knee Active Problems:   OA (osteoarthritis) of knee  Estimated body mass index is 36.91 kg/m as calculated from the following:   Height as of this encounter: 5' (1.524 m).   Weight as of this encounter: 85.7 kg (189 lb).  Procedure:  Procedure(s) (LRB): LEFT TOTAL KNEE ARTHROPLASTY (Left)   Consults: None  HPI: Kara Schultz is a 76 y.o. year old female with end stage OA of her left knee with progressively worsening pain and dysfunction. She has constant pain, with activity and at rest and significant functional deficits with difficulties even with ADLs. She has had extensive non-op management including analgesics, injections of cortisone and viscosupplements, and home exercise program, but remains in significant pain with significant dysfunction. Radiographs show bone on bone arthritis lateral and patellofemoral. She presents now for left Total Knee Arthroplasty.   Laboratory Data: Admission on 03/08/2017  Component Date Value Ref Range Status  . WBC 03/09/2017 8.4  4.0 - 10.5 K/uL Final  . RBC 03/09/2017 3.59* 3.87 - 5.11 MIL/uL Final  . Hemoglobin 03/09/2017 10.6* 12.0 - 15.0 g/dL Final  . HCT 03/09/2017 32.7* 36.0 - 46.0 % Final  . MCV 03/09/2017 91.1  78.0 - 100.0 fL Final  . MCH 03/09/2017 29.5  26.0 - 34.0 pg Final  . MCHC 03/09/2017 32.4  30.0 - 36.0 g/dL Final  . RDW 03/09/2017 13.6  11.5 - 15.5 % Final  . Platelets 03/09/2017 193  150  - 400 K/uL Final  . Sodium 03/09/2017 139  135 - 145 mmol/L Final  . Potassium 03/09/2017 4.2  3.5 - 5.1 mmol/L Final  . Chloride 03/09/2017 105  101 - 111 mmol/L Final  . CO2 03/09/2017 29  22 - 32 mmol/L Final  . Glucose, Bld 03/09/2017 112* 65 - 99 mg/dL Final  . BUN 03/09/2017 7  6 - 20 mg/dL Final  . Creatinine, Ser 03/09/2017 0.70  0.44 - 1.00 mg/dL Final  . Calcium 03/09/2017 8.5* 8.9 - 10.3 mg/dL Final  . GFR calc non Af Amer 03/09/2017 >60  >60 mL/min Final  . GFR calc Af Amer 03/09/2017 >60  >60 mL/min Final   Comment: (NOTE) The eGFR has been calculated using the CKD EPI equation. This calculation has not been validated in all clinical situations. eGFR's persistently <60 mL/min signify possible Chronic Kidney Disease.   . Anion gap 03/09/2017 5  5 - 15 Final  . WBC 03/10/2017 11.1* 4.0 - 10.5 K/uL Final  . RBC 03/10/2017 3.66* 3.87 - 5.11 MIL/uL Final  . Hemoglobin 03/10/2017 10.5* 12.0 - 15.0 g/dL Final  . HCT 03/10/2017 33.2* 36.0 - 46.0 % Final  . MCV 03/10/2017 90.7  78.0 - 100.0 fL Final  . MCH 03/10/2017 28.7  26.0 - 34.0 pg Final  . MCHC 03/10/2017 31.6  30.0 - 36.0 g/dL Final  . RDW 03/10/2017 13.9  11.5 - 15.5 % Final  . Platelets 03/10/2017 219  150 - 400 K/uL Final  .  Sodium 03/10/2017 140  135 - 145 mmol/L Final  . Potassium 03/10/2017 3.6  3.5 - 5.1 mmol/L Final  . Chloride 03/10/2017 103  101 - 111 mmol/L Final  . CO2 03/10/2017 28  22 - 32 mmol/L Final  . Glucose, Bld 03/10/2017 126* 65 - 99 mg/dL Final  . BUN 03/10/2017 11  6 - 20 mg/dL Final  . Creatinine, Ser 03/10/2017 0.83  0.44 - 1.00 mg/dL Final  . Calcium 03/10/2017 9.0  8.9 - 10.3 mg/dL Final  . GFR calc non Af Amer 03/10/2017 >60  >60 mL/min Final  . GFR calc Af Amer 03/10/2017 >60  >60 mL/min Final   Comment: (NOTE) The eGFR has been calculated using the CKD EPI equation. This calculation has not been validated in all clinical situations. eGFR's persistently <60 mL/min signify possible  Chronic Kidney Disease.   Georgiann Hahn gap 03/10/2017 9  5 - 15 Final  Hospital Outpatient Visit on 02/25/2017  Component Date Value Ref Range Status  . aPTT 02/25/2017 36  24 - 36 seconds Final  . WBC 02/25/2017 7.7  4.0 - 10.5 K/uL Final  . RBC 02/25/2017 4.41  3.87 - 5.11 MIL/uL Final  . Hemoglobin 02/25/2017 12.9  12.0 - 15.0 g/dL Final  . HCT 02/25/2017 40.2  36.0 - 46.0 % Final  . MCV 02/25/2017 91.2  78.0 - 100.0 fL Final  . MCH 02/25/2017 29.3  26.0 - 34.0 pg Final  . MCHC 02/25/2017 32.1  30.0 - 36.0 g/dL Final  . RDW 02/25/2017 13.8  11.5 - 15.5 % Final  . Platelets 02/25/2017 218  150 - 400 K/uL Final  . Sodium 02/25/2017 140  135 - 145 mmol/L Final  . Potassium 02/25/2017 4.4  3.5 - 5.1 mmol/L Final  . Chloride 02/25/2017 106  101 - 111 mmol/L Final  . CO2 02/25/2017 29  22 - 32 mmol/L Final  . Glucose, Bld 02/25/2017 97  65 - 99 mg/dL Final  . BUN 02/25/2017 14  6 - 20 mg/dL Final  . Creatinine, Ser 02/25/2017 0.69  0.44 - 1.00 mg/dL Final  . Calcium 02/25/2017 9.3  8.9 - 10.3 mg/dL Final  . Total Protein 02/25/2017 6.9  6.5 - 8.1 g/dL Final  . Albumin 02/25/2017 3.7  3.5 - 5.0 g/dL Final  . AST 02/25/2017 26  15 - 41 U/L Final  . ALT 02/25/2017 20  14 - 54 U/L Final  . Alkaline Phosphatase 02/25/2017 111  38 - 126 U/L Final  . Total Bilirubin 02/25/2017 0.5  0.3 - 1.2 mg/dL Final  . GFR calc non Af Amer 02/25/2017 >60  >60 mL/min Final  . GFR calc Af Amer 02/25/2017 >60  >60 mL/min Final   Comment: (NOTE) The eGFR has been calculated using the CKD EPI equation. This calculation has not been validated in all clinical situations. eGFR's persistently <60 mL/min signify possible Chronic Kidney Disease.   . Anion gap 02/25/2017 5  5 - 15 Final  . Prothrombin Time 02/25/2017 13.8  11.4 - 15.2 seconds Final  . INR 02/25/2017 1.06   Final  . ABO/RH(D) 02/25/2017 O NEG   Final  . Antibody Screen 02/25/2017 NEG   Final  . Sample Expiration 02/25/2017 03/11/2017   Final  .  Extend sample reason 02/25/2017 NO TRANSFUSIONS OR PREGNANCY IN THE PAST 3 MONTHS   Final  . MRSA, PCR 02/25/2017 NEGATIVE  NEGATIVE Final  . Staphylococcus aureus 02/25/2017 NEGATIVE  NEGATIVE Final   Comment:  The Xpert SA Assay (FDA approved for NASAL specimens in patients over 30 years of age), is one component of a comprehensive surveillance program.  Test performance has been validated by [facility] for patients greater than or equal to 30 year old. It is not intended to diagnose infection nor to guide or monitor treatment.   . ABO/RH(D) 02/25/2017 O NEG   Final     X-Rays:No results found.  EKG:No orders found for this or any previous visit.   Hospital Course: Tymesha Ditmore is a 76 y.o. who was admitted to [facility]. They were brought to the operating room on 03/08/2017 and underwent Procedure(s): LEFT TOTAL KNEE ARTHROPLASTY.  Patient tolerated the procedure well and was later transferred to the recovery room and then to the orthopaedic floor for postoperative care.  They were given PO and IV analgesics for pain control following their surgery.  They were given 24 hours of postoperative antibiotics of  Anti-infectives    Start     Dose/Rate Route Frequency Ordered Stop   03/08/17 1745  ceFAZolin (ANCEF) IVPB 2g/100 mL premix     2 g 200 mL/hr over 30 Minutes Intravenous Every 6 hours 03/08/17 1445 03/09/17 0002   03/08/17 0930  ceFAZolin (ANCEF) 2-4 GM/100ML-% IVPB    Comments:  Algis Liming   : cabinet override      03/08/17 0930 03/08/17 1145   03/08/17 0922  ceFAZolin (ANCEF) IVPB 2g/100 mL premix     2 g 200 mL/hr over 30 Minutes Intravenous On call to O.R. 03/08/17 0998 03/08/17 1145     and started on DVT prophylaxis in the form of Xarelto.   PT and OT were ordered for total joint protocol.  Discharge planning consulted to help with postop disposition and equipment needs.  Patient had a decent night on the evening of surgery.  They started to  get up OOB with therapy on day one. Hemovac drain was pulled without difficulty.  Continued to work with therapy into day two.  Dressing was changed on day two and the incision was healing well.  Patient was seen in rounds and was ready to go home.    Diet - Regular diet Follow up - in 2 weeks Activity - WBAT Disposition - Home Condition Upon Discharge - Stable D/C Meds - See DC Summary DVT Prophylaxis - Xarelto  Discharge Instructions    Call MD / Call 911    Complete by:  As directed    If you experience chest pain or shortness of breath, CALL 911 and be transported to the hospital emergency room.  If you develope a fever above 101 F, pus (white drainage) or increased drainage or redness at the wound, or calf pain, call your surgeon's office.   Change dressing    Complete by:  As directed    Change dressing daily with sterile 4 x 4 inch gauze dressing and apply TED hose. Do not submerge the incision under water.   Constipation Prevention    Complete by:  As directed    Drink plenty of fluids.  Prune juice may be helpful.  You may use a stool softener, such as Colace (over the counter) 100 mg twice a day.  Use MiraLax (over the counter) for constipation as needed.   Diet - low sodium heart healthy    Complete by:  As directed    Discharge instructions    Complete by:  As directed    Take Xarelto for two and  a half more weeks, then discontinue Xarelto. Once the patient has completed the blood thinner regimen, then take a Baby 81 mg Aspirin daily for three more weeks.   Pick up stool softner and laxative for home use following surgery while on pain medications. Do not submerge incision under water. Please use good hand washing techniques while changing dressing each day. May shower starting three days after surgery. Please use a clean towel to pat the incision dry following showers. Continue to use ice for pain and swelling after surgery. Do not use any lotions or creams on the  incision until instructed by your surgeon.  Wear both TED hose on both legs during the day every day for three weeks, but may remove the TED hose at night at home.  Postoperative Constipation Protocol  Constipation - defined medically as fewer than three stools per week and severe constipation as less than one stool per week.  One of the most common issues patients have following surgery is constipation.  Even if you have a regular bowel pattern at home, your normal regimen is likely to be disrupted due to multiple reasons following surgery.  Combination of anesthesia, postoperative narcotics, change in appetite and fluid intake all can affect your bowels.  In order to avoid complications following surgery, here are some recommendations in order to help you during your recovery period.  Colace (docusate) - Pick up an over-the-counter form of Colace or another stool softener and take twice a day as long as you are requiring postoperative pain medications.  Take with a full glass of water daily.  If you experience loose stools or diarrhea, hold the colace until you stool forms back up.  If your symptoms do not get better within 1 week or if they get worse, check with your doctor.  Dulcolax (bisacodyl) - Pick up over-the-counter and take as directed by the product packaging as needed to assist with the movement of your bowels.  Take with a full glass of water.  Use this product as needed if not relieved by Colace only.   MiraLax (polyethylene glycol) - Pick up over-the-counter to have on hand.  MiraLax is a solution that will increase the amount of water in your bowels to assist with bowel movements.  Take as directed and can mix with a glass of water, juice, soda, coffee, or tea.  Take if you go more than two days without a movement. Do not use MiraLax more than once per day. Call your doctor if you are still constipated or irregular after using this medication for 7 days in a row.  If you continue to  have problems with postoperative constipation, please contact the office for further assistance and recommendations.  If you experience "the worst abdominal pain ever" or develop nausea or vomiting, please contact the office immediatly for further recommendations for treatment.   Do not put a pillow under the knee. Place it under the heel.    Complete by:  As directed    Do not sit on low chairs, stoools or toilet seats, as it may be difficult to get up from low surfaces    Complete by:  As directed    Driving restrictions    Complete by:  As directed    No driving until released by the physician.   Increase activity slowly as tolerated    Complete by:  As directed    Lifting restrictions    Complete by:  As directed    No  lifting until released by the physician.   Patient may shower    Complete by:  As directed    You may shower without a dressing once there is no drainage.  Do not wash over the wound.  If drainage remains, do not shower until drainage stops.   TED hose    Complete by:  As directed    Use stockings (TED hose) for 3 weeks on both leg(s).  You may remove them at night for sleeping.   Weight bearing as tolerated    Complete by:  As directed    Laterality:  left   Extremity:  Lower     Allergies as of 03/10/2017      Reactions   Hydrocodone Nausea Only   Prednisone Other (See Comments)   unknown        Follow-up Information    Gaynelle Arabian, MD. Schedule an appointment as soon as possible for a visit on 03/22/2017.   Specialty:  Orthopedic Surgery Why:  Follow up with Dian Situ or Amber on Monday 03/22/2017 Contact information: [address] [place] [address] [phone]           Signed: Arlee Muslim, PA-C Orthopaedic Surgery 03/10/2017, 10:31 AM

## 2017-03-10 NOTE — Progress Notes (Signed)
Discharge planning, no HH needs identified. Plan for Virtual PT, has DME. [phone] 

## 2017-03-10 NOTE — Progress Notes (Signed)
Physical Therapy Treatment Patient Details Name: Kara Schultz MRN: [id number] DOB: [birth year] Today's Date: 03/10/2017    History of Present Illness 76 yo female s/p L TKA 03/08/17    PT Comments    Pt with c/o dizziness with initial move to sitting but cleared and able to ambulate short distance in hall - BP 122/66.   Follow Up Recommendations        Equipment Recommendations  None recommended by PT    Recommendations for Other Services       Precautions / Restrictions Precautions Precautions: Fall;Knee Required Braces or Orthoses: Knee Immobilizer - Left Knee Immobilizer - Left: Discontinue once straight leg raise with < 10 degree lag Restrictions Weight Bearing Restrictions: No LLE Weight Bearing: Weight bearing as tolerated    Mobility  Bed Mobility Overal bed mobility: Needs Assistance Bed Mobility: Supine to Sit     Supine to sit: Supervision     General bed mobility comments: Increased time  Transfers Overall transfer level: Needs assistance Equipment used: Rolling walker (2 wheeled) Transfers: Sit to/from Stand Sit to Stand: Min guard         General transfer comment: close guard for safety. VCs safety, hand/LE placement  Ambulation/Gait Ambulation/Gait assistance: Min assist;Min guard Ambulation Distance (Feet): 74 Feet Assistive device: Rolling walker (2 wheeled) Gait Pattern/deviations: Step-to pattern;Step-through pattern;Shuffle;Trunk flexed Gait velocity: decr Gait velocity interpretation: Below normal speed for age/gender General Gait Details: cues for posture, position from RW and initial sequence   Stairs            Wheelchair Mobility    Modified Rankin (Stroke Patients Only)       Balance                                            Cognition Arousal/Alertness: Awake/alert Behavior During Therapy: WFL for tasks assessed/performed Overall Cognitive Status: Within Functional Limits for tasks  assessed                                        Exercises Total Joint Exercises Ankle Circles/Pumps: AROM;Both;Supine;15 reps Quad Sets: AROM;Both;Supine;15 reps Heel Slides: AAROM;Left;20 reps;Supine Straight Leg Raises: Left;Supine;AAROM;20 reps Goniometric ROM: AAROM L knee -10 - 75    General Comments        Pertinent Vitals/Pain Pain Assessment: 0-10 Pain Score: 4  Pain Location: L knee with activity Pain Descriptors / Indicators: Aching;Sore Pain Intervention(s): Limited activity within patient's tolerance;Monitored during session;Premedicated before session;Ice applied    Home Living Family/patient expects to be discharged to:: Private residence Living Arrangements: Spouse/significant other Available Help at Discharge: Family Type of Home: House       Home Equipment: Grab bars - toilet;Shower seat - built in;Walker - 2 wheels Additional Comments: daughter will stay with her for awhile.  Husband has a h/o CVA    Prior Function Level of Independence: Independent          PT Goals (current goals can now be found in the care plan section) Acute Rehab PT Goals Patient Stated Goal: regain ind. feel better (experiencing nausea) PT Goal Formulation: With patient Time For Goal Achievement: 03/23/17 Potential to Achieve Goals: Good Progress towards PT goals: Progressing toward goals    Frequency    7X/week      PT  Plan Current plan remains appropriate    Co-evaluation              AM-PAC PT "6 Clicks" Daily Activity  Outcome Measure  Difficulty turning over in bed (including adjusting bedclothes, sheets and blankets)?: A Little Difficulty moving from lying on back to sitting on the side of the bed? : A Little Difficulty sitting down on and standing up from a chair with arms (e.g., wheelchair, bedside commode, etc,.)?: A Little Help needed moving to and from a bed to chair (including a wheelchair)?: A Little Help needed walking in  hospital room?: A Little Help needed climbing 3-5 steps with a railing? : A Little 6 Click Score: 18    End of Session Equipment Utilized During Treatment: Gait belt;Left knee immobilizer Activity Tolerance: Patient tolerated treatment well Patient left: in chair;with call bell/phone within reach;with family/visitor present Nurse Communication: Mobility status PT Visit Diagnosis: Difficulty in walking, not elsewhere classified (R26.2)     Time: 0865-7846 PT Time Calculation (min) (ACUTE ONLY): 35 min  Charges:  $Gait Training: 8-22 mins $Therapeutic Exercise: 8-22 mins                    G Codes:       Pg [phone]    Hoyle Barkdull 03/10/2017, 12:10 PM

## 2017-03-10 NOTE — Progress Notes (Signed)
   Subjective: 2 Days Post-Op Procedure(s) (LRB): LEFT TOTAL KNEE ARTHROPLASTY (Left) Patient reports pain as mild.   Patient seen in rounds with Dr. Lequita Halt. Patient is well, but has had some minor complaints of pain in the knee, requiring pain medications Patient is ready to go home  Objective: Vital signs in last 24 hours: Temp:  [98.1 F (36.7 C)-100.1 F (37.8 C)] 99.5 F (37.5 C) (08/01 0610) Pulse Rate:  [95-116] 100 (08/01 0750) Resp:  [16] 16 (08/01 0610) BP: (95-117)/(46-76) 95/46 (08/01 0750) SpO2:  [93 %-99 %] 99 % (08/01 0610)  Intake/Output from previous day:  Intake/Output Summary (Last 24 hours) at 03/10/17 1027 Last data filed at 03/10/17 0829  Gross per 24 hour  Intake              540 ml  Output             1225 ml  Net             -685 ml    Intake/Output this shift: Total I/O In: 120 [P.O.:120] Out: -   Labs:  Recent Labs  03/09/17 0541 03/10/17 0635  HGB 10.6* 10.5*    Recent Labs  03/09/17 0541 03/10/17 0635  WBC 8.4 11.1*  RBC 3.59* 3.66*  HCT 32.7* 33.2*  PLT 193 219    Recent Labs  03/09/17 0541 03/10/17 0635  NA 139 140  K 4.2 3.6  CL 105 103  CO2 29 28  BUN 7 11  CREATININE 0.70 0.83  GLUCOSE 112* 126*  CALCIUM 8.5* 9.0   No results for input(s): LABPT, INR in the last 72 hours.  EXAM: General - Patient is Alert and Appropriate Extremity - Neurovascular intact Sensation intact distally Intact pulses distally Dorsiflexion/Plantar flexion intact Incision - clean, dry Motor Function - intact, moving foot and toes well on exam.   Assessment/Plan: 2 Days Post-Op Procedure(s) (LRB): LEFT TOTAL KNEE ARTHROPLASTY (Left) Procedure(s) (LRB): LEFT TOTAL KNEE ARTHROPLASTY (Left) Past Medical History:  Diagnosis Date  . 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 Complication of anesthesia   . Crohn's disease ([facility])   . History of kidney stones   . PONV (postoperative nausea and vomiting)     Principal Problem:   Primary osteoarthritis of left knee Active Problems:   OA (osteoarthritis) of knee  Estimated body mass index is 36.91 kg/m as calculated from the following:   Height as of this encounter: 5' (1.524 m).   Weight as of this encounter: 85.7 kg (189 lb). Up with therapy Diet - Regular diet Follow up - in 2 weeks Activity - WBAT Disposition - Home Condition Upon Discharge - Stable D/C Meds - See DC Summary DVT Prophylaxis - Xarelto  Avel Peace, PA-C Orthopaedic Surgery 03/10/2017, 10:27 AM

## 2017-03-10 NOTE — Evaluation (Signed)
Occupational Therapy Evaluation Patient Details Name: Kara Schultz MRN: [id number] DOB: 27-Sep-1940 Today's Date: 03/10/2017    History of Present Illness  76 yo female s/p L TKA 03/08/17   Clinical Impression   Pt was admitted for the above sx.  Evaluation was limited due to pt not feeling well and BP running low. 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  Will follow in acute setting for bathroom transfers.  Goals are for min guard  To supervision    Follow Up Recommendations  Supervision/Assistance - 24 hour    Equipment Recommendations  None recommended by OT    Recommendations for Other Services       Precautions / Restrictions Precautions Precautions: (P) Fall;Knee Required Braces or Orthoses: Knee Immobilizer - Left Knee Immobilizer - Left: Discontinue once straight leg raise with < 10 degree lag Restrictions LLE Weight Bearing: Weight bearing as tolerated      Mobility Bed Mobility         Supine to sit: Supervision        Transfers   Equipment used: Rolling walker (2 wheeled)   Sit to Stand: Min guard         General transfer comment: close guard for safety. VCs safety, hand/LE placement    Balance                                           ADL either performed or assessed with clinical judgement   ADL Overall ADL's : Needs assistance/impaired     Grooming: Set up;Sitting   Upper Body Bathing: Set up;Sitting   Lower Body Bathing: Minimal assistance;Sit to/from stand   Upper Body Dressing : Set up;Sitting   Lower Body Dressing: Minimal assistance;Sit to/from stand   Toilet Transfer: Min guard;Stand-pivot;RW (chair)   Toileting- Architect and Hygiene: Min guard;Sit to/from stand         General ADL Comments: only performed SPT to chair. Pt didn't feel great. States she thinks it is medication related, but BPs also on the low side. See vitals around 1100 am.  Reviewed shower transfer and gave her a handout.  I told her I am willing to come back, if she would like to practice this.  Pt is hoping to d/c home today     Vision         Perception     Praxis      Pertinent Vitals/Pain Pain Assessment: 0-10 Pain Score: 4  Pain Location: L knee with activity Pain Descriptors / Indicators: Aching;Sore Pain Intervention(s): Limited activity within patient's tolerance;Monitored during session;Premedicated before session;Repositioned;Ice applied     Hand Dominance     Extremity/Trunk Assessment Upper Extremity Assessment Upper Extremity Assessment: Overall WFL for tasks assessed           Communication Communication Communication: No difficulties   Cognition Arousal/Alertness: Awake/alert Behavior During Therapy: WFL for tasks assessed/performed Overall Cognitive Status: Within Functional Limits for tasks assessed                                     General Comments       Exercises     Shoulder Instructions      Home Living Family/patient expects to be discharged to:: Private residence Living Arrangements: Spouse/significant other Available Help at Discharge: Family Type  of Home: House             Bathroom Shower/Tub: Producer, television/film/video: Standard     Home Equipment: Grab bars - toilet;Shower seat - built in;Walker - 2 wheels   Additional Comments: daughter will stay with her for awhile.  Husband has a h/o CVA      Prior Functioning/Environment Level of Independence: Independent                 OT Problem List: Decreased activity tolerance;Pain;Decreased knowledge of use of DME or AE      OT Treatment/Interventions: Self-care/ADL training;DME and/or AE instruction;Patient/family education    OT Goals(Current goals can be found in the care plan section) Acute Rehab OT Goals Patient Stated Goal: (P) regain ind. feel better (experiencing nausea) OT Goal Formulation: With patient Time For Goal Achievement: 03/12/17 Potential  to Achieve Goals: Good ADL Goals Pt Will Transfer to Toilet: with supervision;ambulating;regular height toilet;grab bars Pt Will Perform Tub/Shower Transfer: Shower transfer;ambulating;with min guard assist;shower seat  OT Frequency: Min 2X/week   Barriers to D/C:            Co-evaluation              AM-PAC PT "6 Clicks" Daily Activity     Outcome Measure Help from another person eating meals?: None Help from another person taking care of personal grooming?: A Little Help from another person toileting, which includes using toliet, bedpan, or urinal?: A Little Help from another person bathing (including washing, rinsing, drying)?: A Little Help from another person to put on and taking off regular upper body clothing?: A Little Help from another person to put on and taking off regular lower body clothing?: A Little 6 Click Score: 19   End of Session    Activity Tolerance:   Patient left:    OT Visit Diagnosis: Pain Pain - Right/Left: Left Pain - part of body: Knee                Time: 1024-1050 OT Time Calculation (min): 26 min Charges:  OT General Charges $OT Visit: 1 Procedure OT Evaluation $OT Eval Low Complexity: 1 Procedure G-Codes:     Story, OTR/L [phone] 03/10/2017  Kara Schultz 03/10/2017, 12:06 PM

## 2017-03-16 ENCOUNTER — Other Ambulatory Visit: Payer: Self-pay

## 2017-03-16 MED ORDER — NITROFURANTOIN MONOHYD MACRO 100 MG PO CAPS: 100.0000 mg | ORAL_CAPSULE | Freq: Two times a day (BID) | ORAL | 0 refills | Status: AC

## 2018-04-16 IMAGING — DX DG KNEE 1-2V*L*
3 series · 3 of 3 positions shown · non-contrast
Comparison: None.

CLINICAL DATA: Fall.

EXAM:
LEFT KNEE - 1-2 VIEW

[knee ap (1 of 2)]
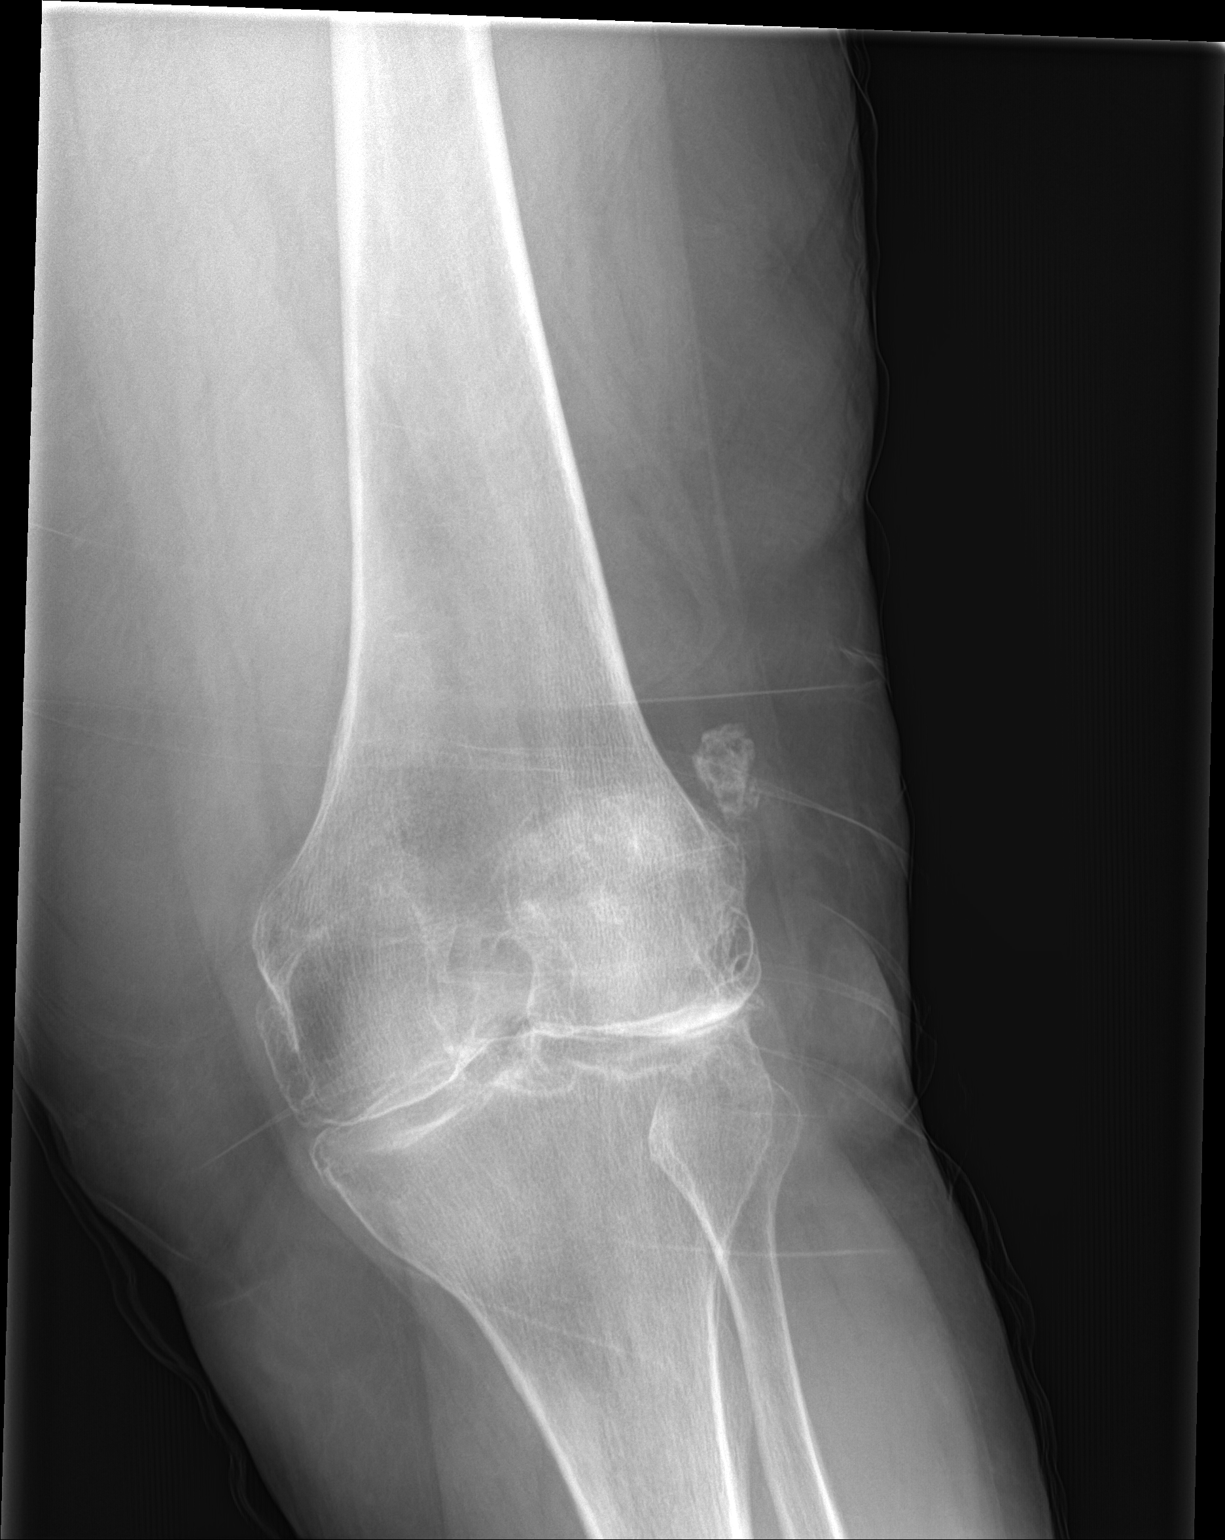

[knee lat]
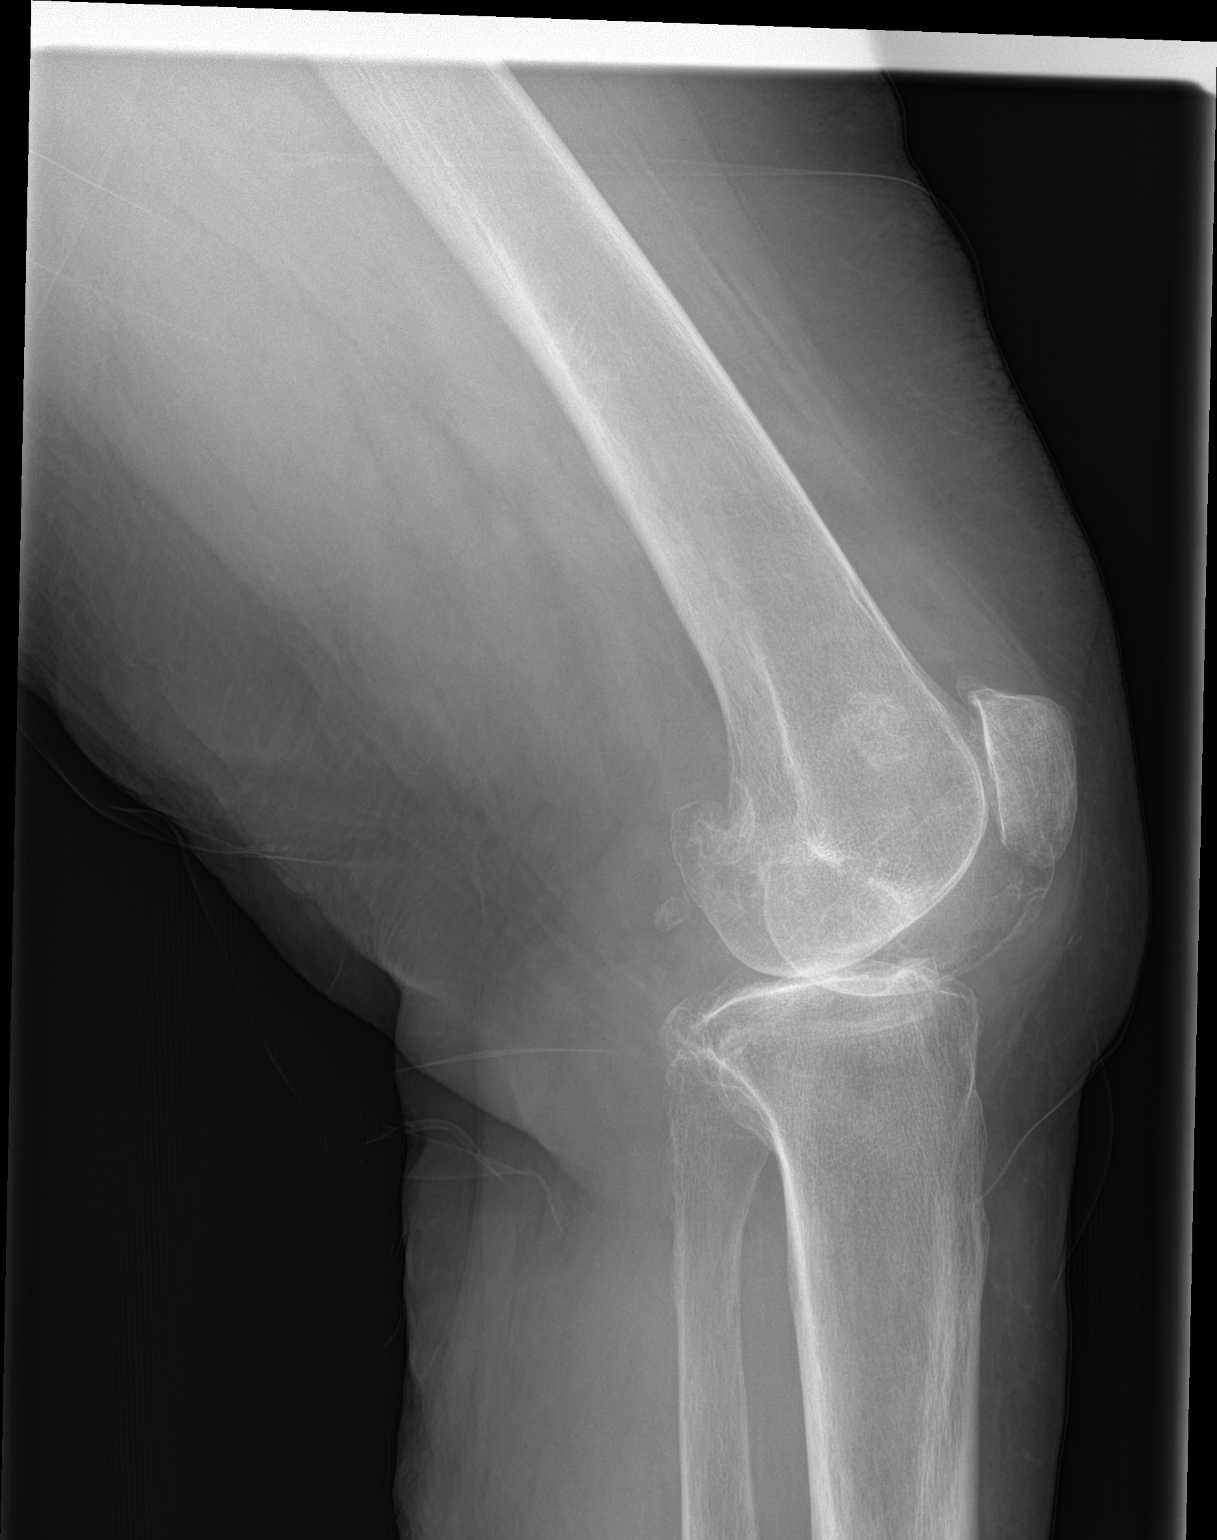

[knee ap (2 of 2)]
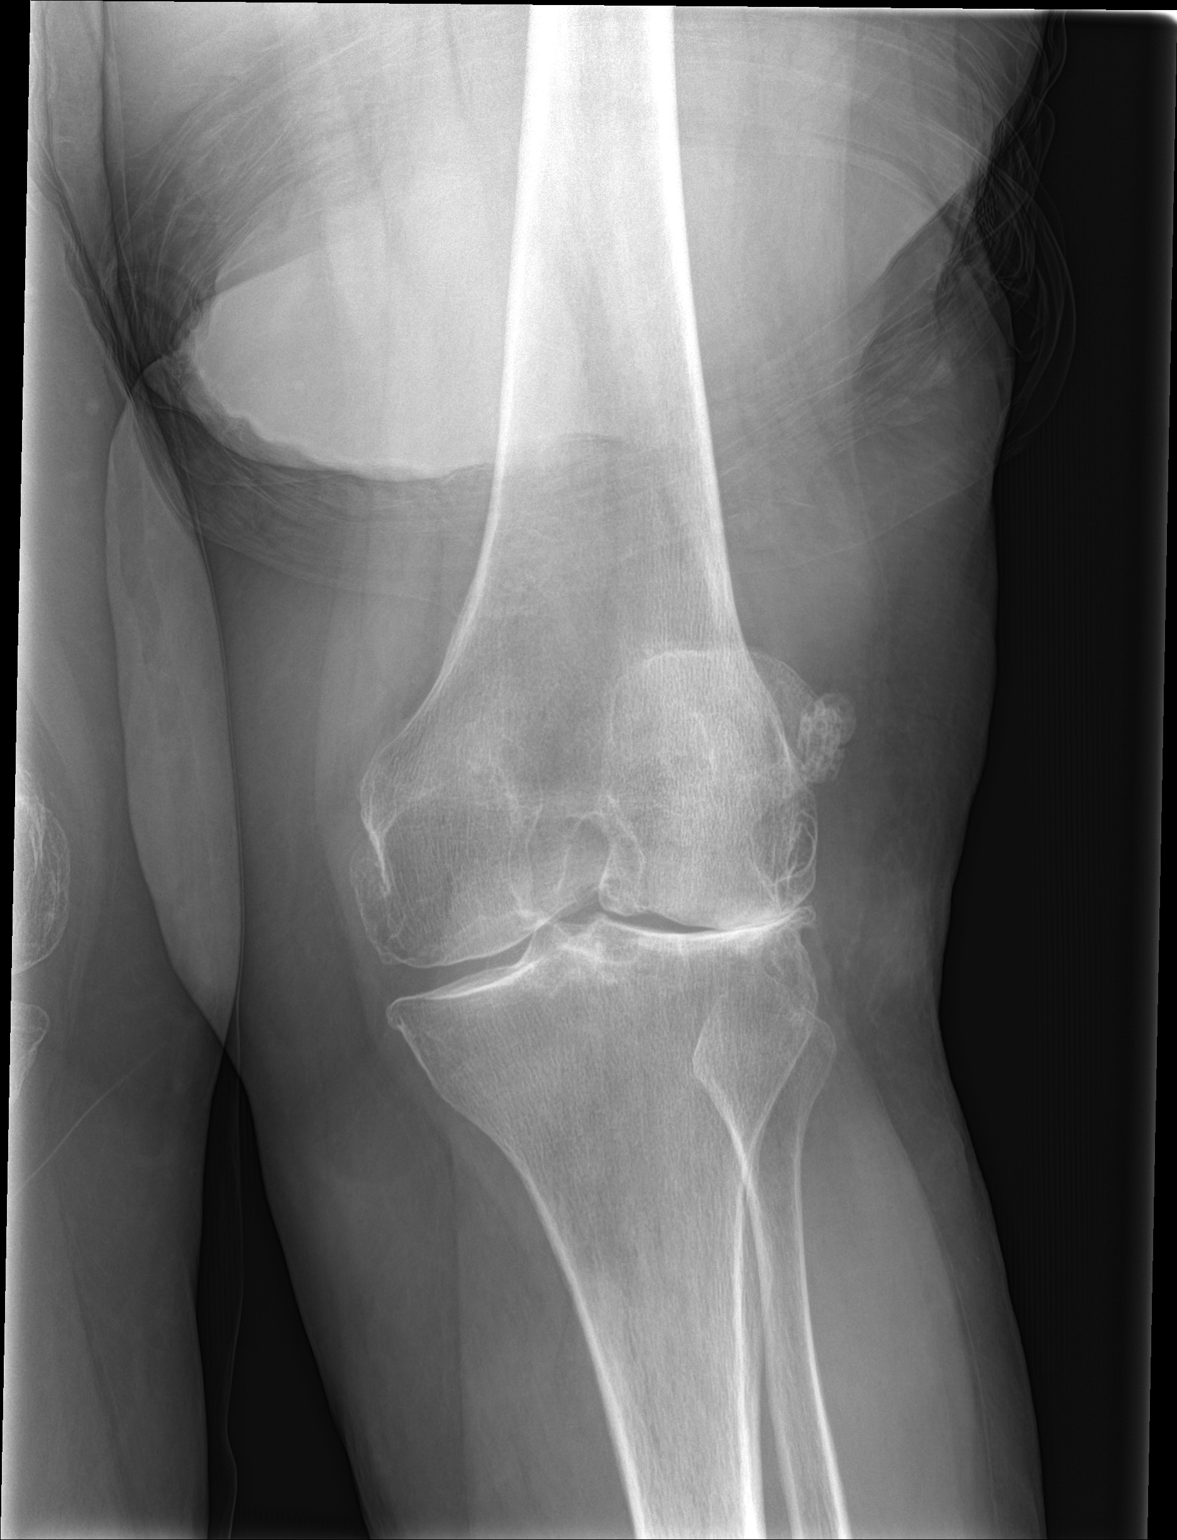

[3 of 3 positions shown; findings below may reference images not displayed]

FINDINGS: No joint effusion. There is marked joint space narrowing and
marginal spur formation involving the patellofemoral, medial and
lateral compartments. Loose body is identified within the lateral
aspect of the suprapatellar joint space. No fracture or subluxation.
IMPRESSION: 1. Advanced tricompartment osteoarthritis.

## 2018-06-04 DIAGNOSIS — Z23 Encounter for immunization: Secondary | ICD-10-CM | POA: Diagnosis not present

## 2019-06-09 DIAGNOSIS — Z23 Encounter for immunization: Secondary | ICD-10-CM | POA: Diagnosis not present

## 2019-07-18 ENCOUNTER — Ambulatory Visit (INDEPENDENT_AMBULATORY_CARE_PROVIDER_SITE_OTHER): Payer: Medicare Other

## 2019-07-18 DIAGNOSIS — Z23 Encounter for immunization: Secondary | ICD-10-CM

## 2020-08-08 ENCOUNTER — Ambulatory Visit (INDEPENDENT_AMBULATORY_CARE_PROVIDER_SITE_OTHER): Payer: Medicare Other

## 2020-08-08 DIAGNOSIS — Z23 Encounter for immunization: Secondary | ICD-10-CM | POA: Diagnosis not present

## 2022-10-03 ENCOUNTER — Telehealth: Payer: Medicare Other | Admitting: Nurse Practitioner

## 2022-10-03 DIAGNOSIS — J40 Bronchitis, not specified as acute or chronic: Secondary | ICD-10-CM | POA: Diagnosis not present

## 2022-10-03 MED ORDER — PREDNISONE 20 MG PO TABS: 40.0000 mg | ORAL_TABLET | Freq: Every day | ORAL | 0 refills | Status: AC

## 2022-10-03 MED ORDER — BENZONATATE 100 MG PO CAPS: 100.0000 mg | ORAL_CAPSULE | Freq: Two times a day (BID) | ORAL | 0 refills | Status: AC | PRN

## 2022-10-03 NOTE — Progress Notes (Signed)
Virtual Visit Consent   Kara Schultz, you are scheduled for a virtual visit with Mary-Margaret Hassell Done, [place], a [facility] provider, today.     Just as with appointments in the office, your consent must be obtained to participate.  Your consent will be active for this visit and any virtual visit you may have with one of our providers in the next 365 days.     If you have a MyChart account, a copy of this consent can be sent to you electronically.  All virtual visits are billed to your insurance company just like a traditional visit in the office.    As this is a virtual visit, video technology does not allow for your provider to perform a traditional examination.  This may limit your provider's ability to fully assess your condition.  If your provider identifies any concerns that need to be evaluated in person or the need to arrange testing (such as labs, EKG, etc.), we will make arrangements to do so.     Although advances in technology are sophisticated, we cannot ensure that it will always work on either your end or our end.  If the connection with a video visit is poor, the visit may have to be switched to a telephone visit.  With either a video or telephone visit, we are not always able to ensure that we have a secure connection.     I need to obtain your verbal consent now.   Are you willing to proceed with your visit today? YES   Kara Schultz has provided verbal consent on 10/03/2022 for a virtual visit (video or telephone).   Mary-Margaret Hassell Done, FNP   Date: 10/03/2022 1:45 PM   Virtual Visit via Video Note   I, Mary-Margaret Hassell Done, connected with Kara Schultz ([id number], 01/31/1941) on 10/03/22 at  3:15 PM EST by a video-enabled telemedicine application and verified that I am speaking with the correct person using two identifiers.  Location: Patient: Virtual Visit Location Patient: Home Provider: Virtual Visit Location Provider: Mobile   I discussed the  limitations of evaluation and management by telemedicine and the availability of in person appointments. The patient expressed understanding and agreed to proceed.    History of Present Illness: Kara Schultz is a 82 y.o. who identifies as a female who was assigned female at birth, and is being seen today for cough.  HPI: Cough for 6 days. Negtive coivd. No body aches.  Cough This is a new problem. Episode onset: 5 days ago. The problem occurs every few minutes. The cough is Productive of sputum. Associated symptoms include rhinorrhea, a sore throat and shortness of breath. Pertinent negatives include no chills, ear congestion or fever. Nothing aggravates the symptoms. Treatments tried: mucinex. The treatment provided mild relief.    Review of Systems  Constitutional:  Negative for chills and fever.  HENT:  Positive for rhinorrhea and sore throat.   Respiratory:  Positive for cough and shortness of breath. Negative for sputum production.   Cardiovascular:  Positive for orthopnea.    Problems:  Patient Active Problem List   Diagnosis Date Noted   OA (osteoarthritis) of knee 03/08/2017   Primary osteoarthritis of left knee 01/01/2017    Allergies:  Allergies  Allergen Reactions   Hydrocodone Nausea Only   Prednisone Other (See Comments)    unknown   Medications:  Current Outpatient Medications:    acetaminophen (TYLENOL) 500 MG tablet, Take 1,000 mg by mouth every 8 (eight) hours as needed for  moderate pain., Disp: , Rfl:    gabapentin (NEURONTIN) 300 MG capsule, Take 1 capsule (300 mg total) by mouth 3 (three) times daily. Gabapentin 300 mg Protocol Take a 300 mg capsule three times a day for one week, Then a 300 mg capsule twice a day for one week, Then a 300 mg capsule once a day for one week, then discontinue the Gabapentin., Disp: 42 capsule, Rfl: 0   HYDROmorphone (DILAUDID) 2 MG tablet, Take 1-2 tablets (2-4 mg total) by mouth every 4 (four) hours as needed for severe  pain., Disp: 60 tablet, Rfl: 0   methocarbamol (ROBAXIN) 500 MG tablet, Take 1 tablet (500 mg total) by mouth every 6 (six) hours as needed for muscle spasms., Disp: 80 tablet, Rfl: 0   nitrofurantoin, macrocrystal-monohydrate, (MACROBID) 100 MG capsule, Take 1 capsule (100 mg total) by mouth 2 (two) times daily. 1 po BId, Disp: 14 capsule, Rfl: 0   rivaroxaban (XARELTO) 10 MG TABS tablet, Take 1 tablet (10 mg total) by mouth daily with breakfast. Take Xarelto for two and a half more weeks following discharge from the hospital, then discontinue Xarelto. Once the patient has completed the blood thinner regimen, then take a Baby 81 mg Aspirin daily for three more weeks., Disp: 19 tablet, Rfl: 0   traMADol (ULTRAM) 50 MG tablet, Take 1-2 tablets (50-100 mg total) by mouth every 6 (six) hours as needed for moderate pain., Disp: 56 tablet, Rfl: 0  Observations/Objective: Patient is well-developed, well-nourished in no acute distress.  Resting comfortably at home.  Head is normocephalic, atraumatic.  No labored breathing.  Speech is clear and coherent with logical content.  Patient is alert and oriented at baseline.  Raspy voice Deep dry sounding cough  Assessment and Plan:  Kara Schultz in today with chief complaint of No chief complaint on file.   1. Bronchitis 1. Take meds as prescribed 2. Use a cool mist humidifier especially during the winter months and when heat has been humid. 3. Use saline nose sprays frequently 4. Saline irrigations of the nose can be very helpful if done frequently.  * 4X daily for 1 week*  * Use of a nettie pot can be helpful with this. Follow directions with this* 5. Drink plenty of fluids 6. Keep thermostat turn down low 7.For any cough or congestion- tessalon perles 8. For fever or aces or pains- take tylenol or ibuprofen appropriate for age and weight.  * for fevers greater than 101 orally you may alternate ibuprofen and tylenol every  3  hours.   Patient denies being allergic  to prednisone- was removed from chart  Meds ordered this encounter  Medications   predniSONE (DELTASONE) 20 MG tablet    Sig: Take 2 tablets (40 mg total) by mouth daily with breakfast for 5 days. 2 po daily for 5 days    Dispense:  10 tablet    Refill:  0    Order Specific Question:   Supervising Provider    Answer:   Chase Picket [id number]   benzonatate (TESSALON) 100 MG capsule    Sig: Take 1 capsule (100 mg total) by mouth 2 (two) times daily as needed for cough.    Dispense:  20 capsule    Refill:  0    Order Specific Question:   Supervising Provider    Answer:   Chase Picket [id number]       Follow Up Instructions: I discussed the assessment and treatment plan with the patient.  The patient was provided an opportunity to ask questions and all were answered. The patient agreed with the plan and demonstrated an understanding of the instructions.  A copy of instructions were sent to the patient via MyChart.  The patient was advised to call back or seek an in-person evaluation if the symptoms worsen or if the condition fails to improve as anticipated.  Time:  I spent 6 minutes with the patient via telehealth technology discussing the above problems/concerns.    Mary-Margaret Hassell Done, FNP

## 2022-10-03 NOTE — Patient Instructions (Signed)
Kara Schultz, thank you for joining Chevis Pretty, FNP for today's virtual visit.  While this provider is not your primary care provider (PCP), if your PCP is located in our provider database this encounter information will be shared with them immediately following your visit.   [facility] account gives you access to today's visit and all your visits, tests, and labs performed at [facility] " click here if you don't have a [facility] account or go to [email or web address]  Consent: (Patient) Kara Schultz provided verbal consent for this virtual visit at the beginning of the encounter.  Current Medications:  Current Outpatient Medications:    benzonatate (TESSALON) 100 MG capsule, Take 1 capsule (100 mg total) by mouth 2 (two) times daily as needed for cough., Disp: 20 capsule, Rfl: 0   predniSONE (DELTASONE) 20 MG tablet, Take 2 tablets (40 mg total) by mouth daily with breakfast for 5 days. 2 po daily for 5 days, Disp: 10 tablet, Rfl: 0   acetaminophen (TYLENOL) 500 MG tablet, Take 1,000 mg by mouth every 8 (eight) hours as needed for moderate pain., Disp: , Rfl:    gabapentin (NEURONTIN) 300 MG capsule, Take 1 capsule (300 mg total) by mouth 3 (three) times daily. Gabapentin 300 mg Protocol Take a 300 mg capsule three times a day for one week, Then a 300 mg capsule twice a day for one week, Then a 300 mg capsule once a day for one week, then discontinue the Gabapentin., Disp: 42 capsule, Rfl: 0   HYDROmorphone (DILAUDID) 2 MG tablet, Take 1-2 tablets (2-4 mg total) by mouth every 4 (four) hours as needed for severe pain., Disp: 60 tablet, Rfl: 0   methocarbamol (ROBAXIN) 500 MG tablet, Take 1 tablet (500 mg total) by mouth every 6 (six) hours as needed for muscle spasms., Disp: 80 tablet, Rfl: 0   nitrofurantoin, macrocrystal-monohydrate, (MACROBID) 100 MG capsule, Take 1 capsule (100 mg total) by mouth 2 (two) times daily. 1 po BId, Disp:  14 capsule, Rfl: 0   rivaroxaban (XARELTO) 10 MG TABS tablet, Take 1 tablet (10 mg total) by mouth daily with breakfast. Take Xarelto for two and a half more weeks following discharge from the hospital, then discontinue Xarelto. Once the patient has completed the blood thinner regimen, then take a Baby 81 mg Aspirin daily for three more weeks., Disp: 19 tablet, Rfl: 0   traMADol (ULTRAM) 50 MG tablet, Take 1-2 tablets (50-100 mg total) by mouth every 6 (six) hours as needed for moderate pain., Disp: 56 tablet, Rfl: 0   Medications ordered in this encounter:  Meds ordered this encounter  Medications   predniSONE (DELTASONE) 20 MG tablet    Sig: Take 2 tablets (40 mg total) by mouth daily with breakfast for 5 days. 2 po daily for 5 days    Dispense:  10 tablet    Refill:  0    Order Specific Question:   Supervising Provider    Answer:   Chase Picket [id number]   benzonatate (TESSALON) 100 MG capsule    Sig: Take 1 capsule (100 mg total) by mouth 2 (two) times daily as needed for cough.    Dispense:  20 capsule    Refill:  0    Order Specific Question:   Supervising Provider    Answer:   Chase Picket [id number]     *If you need refills on other medications prior to your next appointment, please contact your pharmacy*  Follow-Up:  Call back or seek an in-person evaluation if the symptoms worsen or if the condition fails to improve as anticipated.  [place] [phone]  Other Instructions 1. Take meds as prescribed 2. Use a cool mist humidifier especially during the winter months and when heat has been humid. 3. Use saline nose sprays frequently 4. Saline irrigations of the nose can be very helpful if done frequently.  * 4X daily for 1 week*  * Use of a nettie pot can be helpful with this. Follow directions with this* 5. Drink plenty of fluids 6. Keep thermostat turn down low 7.For any cough or congestion- tessalon perles 8. For fever or aces or pains- take  tylenol or ibuprofen appropriate for age and weight.  * for fevers greater than 101 orally you may alternate ibuprofen and tylenol every  3 hours.      If you have been instructed to have an in-person evaluation today at a local Urgent Care facility, please use the link below. It will take you to a list of all of our available [place] [facility], including address, phone number and hours of operation. Please do not delay care.  [facility]  If you or a family member do not have a primary care provider, use the link below to schedule a visit and establish care. When you choose a [facility] primary care physician or advanced practice provider, you gain a long-term partner in health. Find a Primary Care Provider  Learn more about [facility] [place]'s in-office and virtual care options: [facility]

## 2022-11-30 ENCOUNTER — Encounter: Payer: Self-pay | Admitting: Gastroenterology

## 2023-01-27 ENCOUNTER — Ambulatory Visit: Payer: Medicare Other | Admitting: Gastroenterology
